# Patient Record
Sex: Female | Born: 1960 | Race: White | Hispanic: No | Marital: Married | Smoking: Never smoker
Health system: Southern US, Community
[De-identification: ages and names within clinical notes are randomized; demographics above are authoritative.]

## PROBLEM LIST (undated history)

## (undated) DIAGNOSIS — M8580 Other specified disorders of bone density and structure, unspecified site: Secondary | ICD-10-CM

## (undated) DIAGNOSIS — F329 Major depressive disorder, single episode, unspecified: Secondary | ICD-10-CM

## (undated) DIAGNOSIS — M5481 Occipital neuralgia: Secondary | ICD-10-CM

## (undated) DIAGNOSIS — S329XXA Fracture of unspecified parts of lumbosacral spine and pelvis, initial encounter for closed fracture: Secondary | ICD-10-CM

## (undated) DIAGNOSIS — Z8742 Personal history of other diseases of the female genital tract: Secondary | ICD-10-CM

## (undated) DIAGNOSIS — I10 Essential (primary) hypertension: Secondary | ICD-10-CM

## (undated) DIAGNOSIS — F32A Depression, unspecified: Secondary | ICD-10-CM

## (undated) DIAGNOSIS — H9319 Tinnitus, unspecified ear: Secondary | ICD-10-CM

## (undated) DIAGNOSIS — K219 Gastro-esophageal reflux disease without esophagitis: Secondary | ICD-10-CM

## (undated) DIAGNOSIS — M1990 Unspecified osteoarthritis, unspecified site: Secondary | ICD-10-CM

## (undated) DIAGNOSIS — E7800 Pure hypercholesterolemia, unspecified: Secondary | ICD-10-CM

## (undated) DIAGNOSIS — F419 Anxiety disorder, unspecified: Secondary | ICD-10-CM

## (undated) DIAGNOSIS — IMO0002 Reserved for concepts with insufficient information to code with codable children: Secondary | ICD-10-CM

## (undated) HISTORY — DX: Personal history of other diseases of the female genital tract: Z87.42

## (undated) HISTORY — DX: Gastro-esophageal reflux disease without esophagitis: K21.9

## (undated) HISTORY — DX: Occipital neuralgia: M54.81

## (undated) HISTORY — PX: SKIN CANCER EXCISION: SHX779

## (undated) HISTORY — DX: Depression, unspecified: F32.A

## (undated) HISTORY — DX: Other specified disorders of bone density and structure, unspecified site: M85.80

## (undated) HISTORY — DX: Fracture of unspecified parts of lumbosacral spine and pelvis, initial encounter for closed fracture: S32.9XXA

## (undated) HISTORY — DX: Reserved for concepts with insufficient information to code with codable children: IMO0002

## (undated) HISTORY — PX: OTHER SURGICAL HISTORY: SHX169

## (undated) HISTORY — DX: Essential (primary) hypertension: I10

## (undated) HISTORY — DX: Major depressive disorder, single episode, unspecified: F32.9

## (undated) HISTORY — PX: NOSE SURGERY: SHX723

---
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
## 1991-06-19 DIAGNOSIS — Z8742 Personal history of other diseases of the female genital tract: Secondary | ICD-10-CM

## 1991-06-19 HISTORY — DX: Personal history of other diseases of the female genital tract: Z87.42

## 1991-07-20 HISTORY — PX: WISDOM TOOTH EXTRACTION: SHX21

## 1991-07-20 HISTORY — PX: CERVICAL BIOPSY  W/ LOOP ELECTRODE EXCISION: SUR135

## 1999-08-10 ENCOUNTER — Other Ambulatory Visit: Admission: RE | Admit: 1999-08-10 | Discharge: 1999-08-10 | Payer: Self-pay | Admitting: *Deleted

## 1999-08-14 ENCOUNTER — Encounter: Payer: Self-pay | Admitting: Family Medicine

## 1999-08-14 ENCOUNTER — Encounter: Admission: RE | Admit: 1999-08-14 | Discharge: 1999-08-14 | Payer: Self-pay | Admitting: Family Medicine

## 2000-07-04 ENCOUNTER — Encounter: Admission: RE | Admit: 2000-07-04 | Discharge: 2000-07-04 | Payer: Self-pay | Admitting: Family Medicine

## 2000-07-04 ENCOUNTER — Encounter: Payer: Self-pay | Admitting: Family Medicine

## 2000-09-29 ENCOUNTER — Other Ambulatory Visit: Admission: RE | Admit: 2000-09-29 | Discharge: 2000-09-29 | Payer: Self-pay | Admitting: *Deleted

## 2001-08-08 ENCOUNTER — Encounter: Admission: RE | Admit: 2001-08-08 | Discharge: 2001-08-08 | Payer: Self-pay | Admitting: *Deleted

## 2001-08-08 ENCOUNTER — Encounter: Payer: Self-pay | Admitting: *Deleted

## 2001-11-10 ENCOUNTER — Other Ambulatory Visit: Admission: RE | Admit: 2001-11-10 | Discharge: 2001-11-10 | Payer: Self-pay | Admitting: *Deleted

## 2002-07-19 DIAGNOSIS — M5481 Occipital neuralgia: Secondary | ICD-10-CM

## 2002-07-19 HISTORY — DX: Occipital neuralgia: M54.81

## 2002-08-21 ENCOUNTER — Encounter: Admission: RE | Admit: 2002-08-21 | Discharge: 2002-08-21 | Payer: Self-pay | Admitting: *Deleted

## 2002-08-21 ENCOUNTER — Encounter: Payer: Self-pay | Admitting: *Deleted

## 2003-01-21 ENCOUNTER — Other Ambulatory Visit: Admission: RE | Admit: 2003-01-21 | Discharge: 2003-01-21 | Payer: Self-pay | Admitting: *Deleted

## 2003-09-04 ENCOUNTER — Ambulatory Visit (HOSPITAL_COMMUNITY): Admission: RE | Admit: 2003-09-04 | Discharge: 2003-09-04 | Payer: Self-pay | Admitting: Family Medicine

## 2004-05-12 ENCOUNTER — Other Ambulatory Visit: Admission: RE | Admit: 2004-05-12 | Discharge: 2004-05-12 | Payer: Self-pay | Admitting: *Deleted

## 2004-08-14 ENCOUNTER — Encounter: Admission: RE | Admit: 2004-08-14 | Discharge: 2004-08-14 | Payer: Self-pay | Admitting: Sports Medicine

## 2004-09-04 ENCOUNTER — Ambulatory Visit (HOSPITAL_COMMUNITY): Admission: RE | Admit: 2004-09-04 | Discharge: 2004-09-04 | Payer: Self-pay | Admitting: *Deleted

## 2004-12-08 ENCOUNTER — Encounter: Admission: RE | Admit: 2004-12-08 | Discharge: 2004-12-08 | Payer: Self-pay | Admitting: Sports Medicine

## 2005-04-16 ENCOUNTER — Other Ambulatory Visit: Admission: RE | Admit: 2005-04-16 | Discharge: 2005-04-16 | Payer: Self-pay | Admitting: *Deleted

## 2005-09-08 ENCOUNTER — Ambulatory Visit (HOSPITAL_COMMUNITY): Admission: RE | Admit: 2005-09-08 | Discharge: 2005-09-08 | Payer: Self-pay | Admitting: Obstetrics and Gynecology

## 2005-10-08 ENCOUNTER — Encounter: Admission: RE | Admit: 2005-10-08 | Discharge: 2005-10-08 | Payer: Self-pay | Admitting: Sports Medicine

## 2005-10-08 ENCOUNTER — Ambulatory Visit: Payer: Self-pay | Admitting: Sports Medicine

## 2005-11-02 ENCOUNTER — Ambulatory Visit: Payer: Self-pay | Admitting: Sports Medicine

## 2006-05-31 ENCOUNTER — Other Ambulatory Visit: Admission: RE | Admit: 2006-05-31 | Discharge: 2006-05-31 | Payer: Self-pay | Admitting: Obstetrics & Gynecology

## 2006-09-09 ENCOUNTER — Ambulatory Visit (HOSPITAL_COMMUNITY): Admission: RE | Admit: 2006-09-09 | Discharge: 2006-09-09 | Payer: Self-pay | Admitting: Obstetrics and Gynecology

## 2007-07-28 ENCOUNTER — Other Ambulatory Visit: Admission: RE | Admit: 2007-07-28 | Discharge: 2007-07-28 | Payer: Self-pay | Admitting: Obstetrics and Gynecology

## 2007-09-12 ENCOUNTER — Ambulatory Visit (HOSPITAL_COMMUNITY): Admission: RE | Admit: 2007-09-12 | Discharge: 2007-09-12 | Payer: Self-pay | Admitting: Obstetrics and Gynecology

## 2008-02-07 ENCOUNTER — Ambulatory Visit: Payer: Self-pay | Admitting: Sports Medicine

## 2008-02-07 DIAGNOSIS — M5431 Sciatica, right side: Secondary | ICD-10-CM | POA: Insufficient documentation

## 2008-02-07 DIAGNOSIS — M5430 Sciatica, unspecified side: Secondary | ICD-10-CM | POA: Insufficient documentation

## 2008-02-07 DIAGNOSIS — M76899 Other specified enthesopathies of unspecified lower limb, excluding foot: Secondary | ICD-10-CM | POA: Insufficient documentation

## 2008-03-26 ENCOUNTER — Ambulatory Visit: Payer: Self-pay | Admitting: Sports Medicine

## 2008-08-09 ENCOUNTER — Other Ambulatory Visit: Admission: RE | Admit: 2008-08-09 | Discharge: 2008-08-09 | Payer: Self-pay | Admitting: Obstetrics and Gynecology

## 2008-08-16 ENCOUNTER — Encounter: Admission: RE | Admit: 2008-08-16 | Discharge: 2008-08-16 | Payer: Self-pay | Admitting: Obstetrics & Gynecology

## 2008-09-09 ENCOUNTER — Ambulatory Visit (HOSPITAL_COMMUNITY): Admission: RE | Admit: 2008-09-09 | Discharge: 2008-09-09 | Payer: Self-pay | Admitting: Obstetrics and Gynecology

## 2009-09-15 ENCOUNTER — Ambulatory Visit (HOSPITAL_COMMUNITY): Admission: RE | Admit: 2009-09-15 | Discharge: 2009-09-15 | Payer: Self-pay | Admitting: Obstetrics and Gynecology

## 2010-02-09 ENCOUNTER — Ambulatory Visit: Payer: Self-pay | Admitting: Sports Medicine

## 2010-02-09 DIAGNOSIS — R42 Dizziness and giddiness: Secondary | ICD-10-CM | POA: Insufficient documentation

## 2010-08-17 ENCOUNTER — Other Ambulatory Visit (HOSPITAL_COMMUNITY): Payer: Self-pay | Admitting: Occupational Therapy

## 2010-08-17 DIAGNOSIS — Z1231 Encounter for screening mammogram for malignant neoplasm of breast: Secondary | ICD-10-CM

## 2010-08-17 DIAGNOSIS — Z139 Encounter for screening, unspecified: Secondary | ICD-10-CM

## 2010-08-20 NOTE — Assessment & Plan Note (Signed)
Summary: LOWER BACK PAIN,MC   Vital Signs:  Patient profile:   50 year old female Height:      66 inches BP sitting:   120 / 82  (left arm) Cuff size:   regular  Vitals Entered By: Tessie Fass CMA (February 09, 2010 9:57 AM) CC: low back pain   Primary Provider:  [facility]  CC:  low back pain.  History of Present Illness: Approx. 3 month history of lightheaded/dizzy spells on going from sitting to standing. These seem to occur periodically for a few days at a time, with a few incidents per day and then resolve for a week or more.  On one occassion she had severe vertigo type symptoms and had to lie down on getting out of her car after a long drive. There are no other neurological or cardiovascular symptoms associated. She has had no change in her menstrual cycle and does not have heavy periods.  She says she has been keeping well hydrated during the warm weather however does seem to lose a lot of salt during exercise.  She also has had low back/piriformis pain for a number of years. These symptoms have not changed much since her last visit 2 years ago, and she seems to be able to cope with the problem. The pain affects her right anterior hip to upper buttocks, and is exacerbated by cycling and prolonged sitting. She has no radicular symptoms.  Physical Exam  General:  pulse rate 60, BP 115/75 alert, well-developed, well-nourished, and well-hydrated.    no real orthostatic change Eyes:  No signs of anaemia Neck:  full ROM.   No vetebro-basilar symptoms producable Heart:  normal rate and regular rhythm.   Msk:  Extension of Lumbar spine is limited and reproduces low back symptoms. Neurologic:  Dix-Hallpike maneuvre negative Additional Exam:  Lying and standing BP- both 115/75 on 2 occasions   Impression & Recommendations:  Problem # 1:  DIZZINESS (ICD-780.4) I think this is likely worsened by salt loss and perhaps some relative dehydration not severe no cardiac  sxs  supplement salt hydrate well and follow  Problem # 2:  SCIATICA (ICD-724.3)  Her updated medication list for this problem includes:    Mobic 15 Mg Tabs (Meloxicam) .Marland Kitchen... 1 by mouth once daily  this is challenging as cycling is major sport trial of addding stretches and exercises to see if response  Complete Medication List: 1)  Bupropion Hcl 300 Mg Xr24h-tab (Bupropion hcl) .Marland Kitchen.. 1 by mouth qd 2)  Mobic 15 Mg Tabs (Meloxicam) .Marland Kitchen.. 1 by mouth qd 3)  Prilosec Otc 20 Mg Tbec (Omeprazole magnesium) .... One by mouth daily  Patient Instructions: 1)  add flexion stretches for back 2)  knee to chest 3)  knee to opposite shoulder 4)  both knees to chest and rock 5)  try thermacare wraps for continuous heat to low back 6)  keep up piriformis stretch 7)  go to [email or web address] and check on high sodium food list 8)  try high sodium for at least 1 month

## 2010-09-21 ENCOUNTER — Ambulatory Visit (HOSPITAL_COMMUNITY)
Admission: RE | Admit: 2010-09-21 | Discharge: 2010-09-21 | Disposition: A | Discharge status: Home / Self Care | Payer: 59 | Source: Ambulatory Visit | Attending: Obstetrics and Gynecology | Admitting: Obstetrics and Gynecology

## 2010-09-21 DIAGNOSIS — Z1231 Encounter for screening mammogram for malignant neoplasm of breast: Secondary | ICD-10-CM | POA: Insufficient documentation

## 2011-08-23 ENCOUNTER — Other Ambulatory Visit (HOSPITAL_COMMUNITY): Payer: Self-pay | Admitting: Internal Medicine

## 2011-08-23 DIAGNOSIS — Z1231 Encounter for screening mammogram for malignant neoplasm of breast: Secondary | ICD-10-CM

## 2011-08-23 LAB — HM PAP SMEAR: HM PAP: NEGATIVE

## 2011-09-28 ENCOUNTER — Ambulatory Visit (HOSPITAL_COMMUNITY)
Admission: RE | Admit: 2011-09-28 | Discharge: 2011-09-28 | Disposition: A | Discharge status: Home / Self Care | Payer: 59 | Source: Ambulatory Visit | Attending: Internal Medicine | Admitting: Internal Medicine

## 2011-09-28 DIAGNOSIS — Z1231 Encounter for screening mammogram for malignant neoplasm of breast: Secondary | ICD-10-CM | POA: Insufficient documentation

## 2011-09-30 ENCOUNTER — Other Ambulatory Visit: Payer: Self-pay | Admitting: Internal Medicine

## 2011-09-30 DIAGNOSIS — R928 Other abnormal and inconclusive findings on diagnostic imaging of breast: Secondary | ICD-10-CM

## 2011-10-07 ENCOUNTER — Ambulatory Visit
Admission: RE | Admit: 2011-10-07 | Discharge: 2011-10-07 | Disposition: A | Discharge status: Home / Self Care | Payer: 59 | Source: Ambulatory Visit | Attending: Internal Medicine | Admitting: Internal Medicine

## 2011-10-07 DIAGNOSIS — R928 Other abnormal and inconclusive findings on diagnostic imaging of breast: Secondary | ICD-10-CM

## 2012-11-20 ENCOUNTER — Encounter: Payer: Self-pay | Admitting: Sports Medicine

## 2012-11-20 ENCOUNTER — Ambulatory Visit (INDEPENDENT_AMBULATORY_CARE_PROVIDER_SITE_OTHER): Payer: 59 | Admitting: Sports Medicine

## 2012-11-20 VITALS — BP 134/90 | HR 61 | Ht 66.0 in | Wt 144.0 lb

## 2012-11-20 DIAGNOSIS — M25559 Pain in unspecified hip: Secondary | ICD-10-CM

## 2012-11-20 DIAGNOSIS — M25551 Pain in right hip: Secondary | ICD-10-CM

## 2012-11-20 NOTE — Progress Notes (Signed)
  Subjective:    Patient ID: Donna Ferrell, female    DOB: 01-Mar-1961, 52 y.o.   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 I initially saw her as a patient at [facility] back in 2006. At that time, an MRI of her right hip showed a stress fracture. She ultimately made a full recovery in her current pain is different in location and what she experienced at that time. She describes an aching discomfort that begins along the posterior lateral hip and at times were radiate down the lateral thigh to the lateral knee. She denies any groin pain. She was seen by Dr. Darrick Penna previously and diagnosed with a piriformis strain/tear and a cortisone injection was administered. The injection worked only for a few days and she developed a little bit of fatty atrophy from the injection which ultimately resolved. She's been working extensively with physical therapy, specifically with Ellamae Sia. He has made some good progress but she has plateaued. She is here today to discuss further treatment options and is asking specifically about prolotherapy. She denies any significant pain past the knee. She denies any significant numbness or tingling in the leg. She has been on Mobic daily for quite some time but is concerned because she is also on a PPI. She has questions about how she should take her Mobic.  Medications are reviewed She has no known drug allergies     Review of Systems     Objective:   Physical Exam Well-developed, well-nourished. No acute distress. Awake alert and oriented x3. Vital signs are reviewed  Right hip demonstrate smooth painless hip range of motion and a negative log roll in a sitting position. There is palpable tenderness along the insertion of both the piriformis ankle views medius tendons. She has fairly good hip abductor strength. Negative  straight leg raise. Strength is 5/5 both lower extremities. Reflexes are equal at the Achilles and patellar tendons. She is walking without significant limp.       Assessment & Plan:  1. Chronic right hip pain likely secondary to gluteus medius strain/tendinopathy  I've given the patient 2 additional exercises to do. They include hip abductor strengthening as well as hip external rotation exercises. We discussed the possibility of PRP with Lenora Boys in [facility] and we also discussed the possibility of prolotherapy. She has a friend who has had prolotherapy in [place]. She will let me know how she would like to proceed in regards to both of these. In the meantime, we will schedule a followup appointment for new orthotics. Dr. Darrick Penna made her some orthotics several years ago and they're quite worn and she would like a new pair.

## 2012-11-30 ENCOUNTER — Ambulatory Visit: Payer: 59 | Admitting: Sports Medicine

## 2012-12-04 ENCOUNTER — Ambulatory Visit (INDEPENDENT_AMBULATORY_CARE_PROVIDER_SITE_OTHER): Payer: 59 | Admitting: Sports Medicine

## 2012-12-04 ENCOUNTER — Encounter: Payer: Self-pay | Admitting: Sports Medicine

## 2012-12-04 VITALS — BP 130/86 | HR 56 | Ht 66.0 in | Wt 144.0 lb

## 2012-12-04 DIAGNOSIS — M25559 Pain in unspecified hip: Secondary | ICD-10-CM

## 2012-12-04 DIAGNOSIS — S66912A Strain of unspecified muscle, fascia and tendon at wrist and hand level, left hand, initial encounter: Secondary | ICD-10-CM

## 2012-12-04 DIAGNOSIS — M25551 Pain in right hip: Secondary | ICD-10-CM

## 2012-12-04 DIAGNOSIS — S63509A Unspecified sprain of unspecified wrist, initial encounter: Secondary | ICD-10-CM

## 2012-12-04 NOTE — Progress Notes (Signed)
  Subjective:    Patient ID: Donna Ferrell, female    DOB: [birth year], 52 y.o.   MRN: [id number]  HPI Patient comes in today for orthotics. She is also complaining of some left wrist pain. Pain began after she was doing some yard work a little over a week ago. She felt a pull along the ulnar aspect of her wrist. No swelling. No bruising. Pain is present only intermittently. No similar problems in the past.    Review of Systems     Objective:   Physical Exam Well-developed, no acute distress  Left wrist: Full range of motion. There is tenderness to palpation along the ECU tendon. No tenderness to palpation over the TFCC. Mild pain with ECU stretching. No soft tissue swelling. No other bony or soft tissue tenderness to direct palpation. Neurovascularly intact distally.       Assessment & Plan:  1. Left wrist pain secondary to mild ECU strain 2. History of gluteus medius strain 3. History of sciatica  Wrist loop to be worn with activity on the left wrist. She will at me know if symptoms persist or worsen. We will ahead and constructed a new pair of orthotics for her today. We did discuss the possibility of prolotherapy at her last office visit for her gluteus medius tendinopathy. She is asking about seeing Dr. Ardell Isaacs here in [place]. I have no objections. She will followup with me when necessary.  Patient was fitted for a : standard, cushioned, semi-rigid orthotic. The orthotic was heated and afterward the patient stood on the orthotic blank positioned on the orthotic stand. The patient was positioned in subtalar neutral position and 10 degrees of ankle dorsiflexion in a weight bearing stance. After completion of molding, a stable base was applied to the orthotic blank. The blank was ground to a stable position for weight bearing. Size:10 Base: EVA Posting: Additional orthotic padding: B/L metatarsal pads

## 2013-04-11 ENCOUNTER — Ambulatory Visit: Payer: 59 | Admitting: Sports Medicine

## 2013-04-23 ENCOUNTER — Encounter: Payer: Self-pay | Admitting: Sports Medicine

## 2013-04-23 ENCOUNTER — Ambulatory Visit (INDEPENDENT_AMBULATORY_CARE_PROVIDER_SITE_OTHER): Payer: 59 | Admitting: Sports Medicine

## 2013-04-23 VITALS — BP 130/91 | HR 66 | Ht 66.0 in | Wt 144.0 lb

## 2013-04-23 DIAGNOSIS — M25561 Pain in right knee: Secondary | ICD-10-CM

## 2013-04-23 DIAGNOSIS — M25569 Pain in unspecified knee: Secondary | ICD-10-CM

## 2013-04-23 MED ORDER — DICLOFENAC SODIUM 1 % TD GEL: 4.0000 g | Freq: Four times a day (QID) | TRANSDERMAL | Status: DC

## 2013-04-23 NOTE — Progress Notes (Signed)
  Subjective:    Patient ID: Donna Ferrell, female    DOB: October 26, 1960, 52 y.o.   MRN: [id number]  HPI chief complaint: Right knee pain   Patient comes in today complaining of several weeks of lateral right knee pain. She has been struggling with right hip pain for quite some time. In fact, she is set to try some dry needling with Amado Coe starting tomorrow. In addition to her hip pain she has begun to develop radiating pain down the lateral thigh to the knee. No associated numbness or tingling. She has not noticed any knee swelling. No giving way. No mechanical symptoms in the knee.    Review of Systems     Objective:   Physical Exam Right hip: Tenderness to palpation at the insertion of the gluteus medius tendon. Smooth painless hip range of motion with a negative logroll. Good hip strength.  Right knee: Full range of motion. No effusion. Mild tenderness to palpation along the distal IT band but no tenderness over the lateral joint line. No tenderness over the medial joint line to palpation. Negative McMurray's but a positive Thessaly's which reproduces pain up the lateral aspect of the right lower leg. Knee is grossly stable to ligamentous exam. Walking without significant limp.       Assessment & Plan:  Right knee pain likely secondary to distal IT band inflammation which is compensatory for chronic right gluteus medius tendinopathy  Patient will proceed with dry needling as scheduled for her right hip. She is well-versed in IT band stretching and has good hip abductor strength on today's exam. Voltaren gel to apply to both the hip and knee as needed. Followup with me in 4 weeks. If symptoms persist consider merits of a diagnostic/therapeutic right knee intra-articular cortisone injection. Call with questions or concerns in the interim.

## 2013-05-21 ENCOUNTER — Encounter: Payer: Self-pay | Admitting: Sports Medicine

## 2013-05-21 ENCOUNTER — Ambulatory Visit (INDEPENDENT_AMBULATORY_CARE_PROVIDER_SITE_OTHER): Payer: 59 | Admitting: Sports Medicine

## 2013-05-21 VITALS — BP 127/80 | HR 70 | Ht 66.0 in | Wt 144.0 lb

## 2013-05-21 DIAGNOSIS — M25559 Pain in unspecified hip: Secondary | ICD-10-CM

## 2013-05-21 DIAGNOSIS — M25551 Pain in right hip: Secondary | ICD-10-CM

## 2013-05-21 DIAGNOSIS — M25569 Pain in unspecified knee: Secondary | ICD-10-CM

## 2013-05-21 DIAGNOSIS — M25561 Pain in right knee: Secondary | ICD-10-CM

## 2013-05-21 NOTE — Progress Notes (Signed)
  Subjective:    Patient ID: Donna Ferrell, female    DOB: [birth year], 52 y.o.   MRN: [id number]  HPI Patient comes in today for followup on right hip and right knee pain. Overall, she is about 50% improved. She has been experimenting with some dry needling which has been done by Amado Coe. She has had good results with this although she is not completely pain-free. Her lateral knee pain is now more in the lateral aspect of her calf. No numbness or tingling. She admits that she has not been doing her hip strengthening exercises. Voltaren gel has been helpful.  Her goal is to return to some running or cycling but she feels like her symptoms are still preventing her from being able to do this.    Review of Systems     Objective:   Physical Exam Well-developed, well-nourished. No acute distress  Right hip: Still some tenderness to palpation at the insertion of the gluteus medius tendon. Still some demonstrable hip abductor weakness. Right knee: Minimal tenderness along the distal IT band. Mild tenderness to palpation along the lateral calf. No atrophy. Neurological exam: Reflexes are trace but equal at the Achilles and patellar tendons bilaterally. She does have 4/5 strength with resisted great toe extension on the right in comparison to the left. Otherwise, strength is 5/5. Patient walks without a limp.      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 She will become more compliant with her hip strengthening and will followup in 4 weeks. Continue with Voltaren gel. Given the location of the lateral calf pain and weakness with great toe extension, there is the possibility that some of her symptoms may be related to L5 nerve root irritation. We discussed the possibility of an EMG/nerve conduction study if symptoms warrant  down the road but given her overall improvement I am going to hold on that for now. Patient will call me with questions or concerns prior to her followup visit.

## 2013-06-18 ENCOUNTER — Ambulatory Visit (INDEPENDENT_AMBULATORY_CARE_PROVIDER_SITE_OTHER): Payer: 59 | Admitting: Sports Medicine

## 2013-06-18 ENCOUNTER — Encounter: Payer: Self-pay | Admitting: Sports Medicine

## 2013-06-18 VITALS — BP 141/82 | HR 61 | Ht 66.0 in | Wt 144.0 lb

## 2013-06-18 DIAGNOSIS — M25561 Pain in right knee: Secondary | ICD-10-CM

## 2013-06-18 DIAGNOSIS — M25569 Pain in unspecified knee: Secondary | ICD-10-CM

## 2013-06-18 NOTE — Progress Notes (Signed)
   Subjective:    Patient ID: Donna Ferrell, female    DOB: [birth year], 52 y.o.   MRN: [id number]  HPI Patient comes in today for followup on lateral right leg and knee pain. She has completed her dry needling sessions and feels like they were helpful. However, she feels like her symptoms haven't really improved since her last office visit. She describes an aching discomfort that begins in the lateral right upper leg and radiates down into the right lateral knee. The lateral calf pain and she was experiencing previously has improved. No weakness. She is able to exercise, specifically bike, without too much discomfort. It is only after exercise that she feels her pain. It will tend to last the rest of the day or until she applies heat. Some numbness and tingling. She admits that she has not been very diligent about doing her hip abductor strengthening but she has been doing quite a bit of stretching.   Review of Systems     Objective:   Physical Exam Sitting comfortably in the exam room. No acute distress.  Smooth painless hip range of motion of the right hip. There is no palpable tenderness along the lateral thigh or distal IT band. No joint effusion. Her last exam showed 4/5 strength with resisted great toe extension on the right when compared to the left but today's exam shows 5/5 strength bilaterally. Remainder of her strength is 5/5 as well. Walking without a limp.      Assessment & Plan:  Persistent right lateral leg and knee pain, likely due to IT band syndrome  Patient will be more diligent about doing her hip abductor strengthening exercises. I've also added an IT band stretch. I've also recommended that she get a bike fit. Followup again with me in 4-5 weeks. Continue with heat as needed. Call with questions or concerns prior to her followup visit.

## 2013-07-23 ENCOUNTER — Ambulatory Visit (INDEPENDENT_AMBULATORY_CARE_PROVIDER_SITE_OTHER): Payer: 59 | Admitting: Sports Medicine

## 2013-07-23 ENCOUNTER — Encounter: Payer: Self-pay | Admitting: Sports Medicine

## 2013-07-23 VITALS — BP 123/85 | HR 69 | Ht 66.0 in | Wt 144.0 lb

## 2013-07-23 DIAGNOSIS — M545 Low back pain, unspecified: Secondary | ICD-10-CM

## 2013-07-23 NOTE — Progress Notes (Signed)
   Subjective:    Patient ID: Donna Ferrell, female    DOB: [birth year], 53 y.o.   MRN: [id number]  HPI Patient comes in today for followup on lateral right hip and leg pain. Lateral hip pain has improved with dry needling and physical therapy. She is now able to sleep on her right side at night more comfortably. However, the aching discomfort along the lateral knee and lateral calf persists. She's also getting some pain in the posterior right hip. No associated numbness or tingling but she does describe a burning and cramping discomfort along the lateral right lower leg which can occur with simply sitting. No groin pain. The leg does want to give way from time to time.    Review of Systems     Objective:   Physical Exam Well-developed, well-nourished. No acute distress  There is minimal tenderness to palpation over the right greater trochanter. Negative log roll. Equivocal straight leg raise. 4+/5 strength with resisted great toe extension on the right compared to the left. Full dorsiflexion and plantar flexion against resistance. There is some tenderness to palpation along the lateral lower leg. Reflexes are trace but equal at the Achilles and patellar tendons. Sensation is intact to light touch grossly. Walking without a limp.       Assessment & Plan:  Persistent lateral knee and lower leg pain-rule out lumbar radiculopathy  AP and lateral x-rays of the lumbar spine. Given her failure to improve with physical therapy and her weakness with great toe extension on the right, I may need to ultimately get an MRI scan of her lumbar spine. I will call her after I reviewed the x-rays of her lumbar spine. Okay to continue with activity as tolerated.

## 2013-07-27 ENCOUNTER — Ambulatory Visit
Admission: RE | Admit: 2013-07-27 | Discharge: 2013-07-27 | Disposition: A | Discharge status: Home / Self Care | Payer: 59 | Source: Ambulatory Visit | Attending: Sports Medicine | Admitting: Sports Medicine

## 2013-07-27 DIAGNOSIS — M545 Low back pain, unspecified: Secondary | ICD-10-CM

## 2013-08-07 ENCOUNTER — Other Ambulatory Visit: Payer: Self-pay | Admitting: Nurse Practitioner

## 2013-08-07 ENCOUNTER — Telehealth: Payer: Self-pay | Admitting: Sports Medicine

## 2013-08-07 DIAGNOSIS — Z1231 Encounter for screening mammogram for malignant neoplasm of breast: Secondary | ICD-10-CM

## 2013-08-07 NOTE — Telephone Encounter (Signed)
Message copied by Ralene CorkRAPER, TIMOTHY R on Tue Aug 07, 2013  3:40 PM ------      Message from: Annita BrodMOORE, NEETON C      Created: Wed Aug 01, 2013 11:31 AM      Regarding: FW: lower back xray      Contact: [phone]                   ----- Message -----         From: Lizbeth BarkMelanie L Ceresi         Sent: 08/01/2013  10:03 AM           To: Lillia PaulsNeeton Moore, CMA      Subject: lower back xray                                          Pt called to get her xray results, she said you can leave a detailed message on her voicemail if she doesn't pick up. ------

## 2013-08-07 NOTE — Telephone Encounter (Signed)
I spoke with the patient on the phone today after reviewing the x-rays of her lumbar spine. She has some degenerative disc disease at L2-L3 as well as L3-L4. She continues to have pain along the lateral aspect of her calf, knee, and thigh. Given her failure with conservative treatment to date I think we should get an MRI scan of her lumbar spine to see if she has any foraminal stenosis that correlates with her symptoms. If so, she would be a candidate for a diagnostic/therapeutic epidural steroid injection. I will call her after reviewing her MRI scan at which point we will delineate further treatment.

## 2013-08-08 ENCOUNTER — Ambulatory Visit (HOSPITAL_COMMUNITY): Payer: 59

## 2013-08-09 ENCOUNTER — Telehealth: Payer: Self-pay | Admitting: *Deleted

## 2013-08-09 DIAGNOSIS — IMO0002 Reserved for concepts with insufficient information to code with codable children: Secondary | ICD-10-CM

## 2013-08-09 NOTE — Telephone Encounter (Signed)
Message copied by Mora BellmanMARTIN, Aadhav Uhlig C on Thu Aug 09, 2013  2:11 PM ------      Message from: Ralene CorkRAPER, TIMOTHY R      Created: Tue Aug 07, 2013  3:42 PM      Regarding: FW: lower back xray      Contact: [phone]       Please schedule an MRI scan of her lumbar spine.      Degenerative disc disease... rule out HNP.            ----- Message -----         From: Lillia PaulsNeeton Moore, CMA         Sent: 08/01/2013  11:31 AM           To: Ralene Corkimothy R Draper, DO      Subject: FW: lower back xray                                                  ----- Message -----         From: Lizbeth BarkMelanie L Ceresi         Sent: 08/01/2013  10:03 AM           To: Lillia PaulsNeeton Moore, CMA      Subject: lower back xray                                          Pt called to get her xray results, she said you can leave a detailed message on her voicemail if she doesn't pick up.       ------

## 2013-08-09 NOTE — Telephone Encounter (Signed)
[facility] [address]

## 2013-08-10 ENCOUNTER — Ambulatory Visit (HOSPITAL_COMMUNITY)
Admission: RE | Admit: 2013-08-10 | Discharge: 2013-08-10 | Disposition: A | Discharge status: Home / Self Care | Payer: 59 | Source: Ambulatory Visit | Attending: Nurse Practitioner | Admitting: Nurse Practitioner

## 2013-08-10 DIAGNOSIS — Z1231 Encounter for screening mammogram for malignant neoplasm of breast: Secondary | ICD-10-CM

## 2013-08-14 ENCOUNTER — Other Ambulatory Visit: Payer: 59

## 2013-08-18 ENCOUNTER — Ambulatory Visit
Admission: RE | Admit: 2013-08-18 | Discharge: 2013-08-18 | Disposition: A | Discharge status: Home / Self Care | Payer: 59 | Source: Ambulatory Visit | Attending: Sports Medicine | Admitting: Sports Medicine

## 2013-08-18 DIAGNOSIS — IMO0002 Reserved for concepts with insufficient information to code with codable children: Secondary | ICD-10-CM

## 2013-08-21 DIAGNOSIS — IMO0002 Reserved for concepts with insufficient information to code with codable children: Secondary | ICD-10-CM

## 2013-08-21 HISTORY — DX: Reserved for concepts with insufficient information to code with codable children: IMO0002

## 2013-08-22 ENCOUNTER — Telehealth: Payer: Self-pay | Admitting: Sports Medicine

## 2013-08-22 ENCOUNTER — Telehealth: Payer: Self-pay | Admitting: *Deleted

## 2013-08-22 ENCOUNTER — Other Ambulatory Visit: Payer: Self-pay | Admitting: Sports Medicine

## 2013-08-22 DIAGNOSIS — M545 Low back pain, unspecified: Secondary | ICD-10-CM

## 2013-08-22 DIAGNOSIS — M549 Dorsalgia, unspecified: Secondary | ICD-10-CM

## 2013-08-22 NOTE — Telephone Encounter (Signed)
[facility]

## 2013-08-22 NOTE — Telephone Encounter (Signed)
Message copied by Mora BellmanMARTIN, Amaziah Raisanen C on Wed Aug 22, 2013  9:57 AM ------      Message from: Reino BellisRAPER, TIMOTHY R      Created: Wed Aug 22, 2013  8:36 AM      Regarding: lumbar ESI       Please schedule a diagnostic/therapeutic lumbar ESI at the L5-S1 level with [facility]. Then schedule a followup visit with me for 2 weeks after the injection.      Thanks!            ----- Message -----         From: Rad Results In Interface         Sent: 08/18/2013  11:28 AM           To: Ralene Corkimothy R Draper, DO                   ------

## 2013-08-22 NOTE — Telephone Encounter (Signed)
I spoke with the patient on the phone today after reviewing the MRI scan of her lumbar spine. In regards to her right lower leg pain and weakness, there are findings at L5-S1 consistent with right foraminal encroachment and possible impingement of the right L5 nerve root. She has degenerative changes at other levels but nothing that would correspond with her right leg symptoms. At this point we will pursue a diagnostic/therapeutic lumbar epidural steroid injection. Patient will followup with me approximately 2 weeks after that procedure for a check on her progress. Call with questions or concerns in the interim.

## 2013-08-22 NOTE — Telephone Encounter (Signed)
Message copied by Ralene CorkRAPER, TIMOTHY R on Wed Aug 22, 2013  8:33 AM ------      Message from: Lizbeth BarkERESI, MELANIE L      Created: Tue Aug 21, 2013 11:02 AM      Regarding: mri results      Contact: [phone]       Pt had MRI on Saturday, she wants to know if you can call her with the results. Thanks! ------

## 2013-08-23 ENCOUNTER — Ambulatory Visit
Admission: RE | Admit: 2013-08-23 | Discharge: 2013-08-23 | Disposition: A | Discharge status: Home / Self Care | Payer: 59 | Source: Ambulatory Visit | Attending: Sports Medicine | Admitting: Sports Medicine

## 2013-08-23 ENCOUNTER — Other Ambulatory Visit: Payer: 59

## 2013-08-23 VITALS — BP 150/94 | HR 55

## 2013-08-23 DIAGNOSIS — M549 Dorsalgia, unspecified: Secondary | ICD-10-CM

## 2013-08-23 DIAGNOSIS — M25561 Pain in right knee: Secondary | ICD-10-CM

## 2013-08-23 MED ORDER — IOHEXOL 180 MG/ML  SOLN
1.0000 mL | Freq: Once | INTRAMUSCULAR | Status: AC | PRN
  Administered 2013-08-23: 1 mL via EPIDURAL

## 2013-08-23 MED ORDER — METHYLPREDNISOLONE ACETATE 40 MG/ML INJ SUSP (RADIOLOG
120.0000 mg | Freq: Once | INTRAMUSCULAR | Status: AC
  Administered 2013-08-23: 120 mg via EPIDURAL

## 2013-08-23 NOTE — Discharge Instructions (Signed)

## 2013-08-24 ENCOUNTER — Encounter: Payer: Self-pay | Admitting: Nurse Practitioner

## 2013-08-27 ENCOUNTER — Encounter: Payer: Self-pay | Admitting: Nurse Practitioner

## 2013-08-27 ENCOUNTER — Ambulatory Visit (INDEPENDENT_AMBULATORY_CARE_PROVIDER_SITE_OTHER): Payer: 59 | Admitting: Nurse Practitioner

## 2013-08-27 VITALS — BP 142/84 | HR 52 | Ht 66.0 in | Wt 141.0 lb

## 2013-08-27 DIAGNOSIS — Z01419 Encounter for gynecological examination (general) (routine) without abnormal findings: Secondary | ICD-10-CM

## 2013-08-27 DIAGNOSIS — M5136 Other intervertebral disc degeneration, lumbar region: Secondary | ICD-10-CM

## 2013-08-27 DIAGNOSIS — F3289 Other specified depressive episodes: Secondary | ICD-10-CM

## 2013-08-27 DIAGNOSIS — F329 Major depressive disorder, single episode, unspecified: Secondary | ICD-10-CM

## 2013-08-27 DIAGNOSIS — N951 Menopausal and female climacteric states: Secondary | ICD-10-CM

## 2013-08-27 DIAGNOSIS — F32A Depression, unspecified: Secondary | ICD-10-CM

## 2013-08-27 DIAGNOSIS — Z8742 Personal history of other diseases of the female genital tract: Secondary | ICD-10-CM

## 2013-08-27 DIAGNOSIS — M5126 Other intervertebral disc displacement, lumbar region: Secondary | ICD-10-CM

## 2013-08-27 NOTE — Patient Instructions (Signed)

## 2013-08-27 NOTE — Progress Notes (Signed)
Patient ID: Donna Ferrell, female   DOB: [birth year], 53 y.o.   MRN: [id number] 53 y.o. G0P0 Significant Other Caucasian Fe here for annual exam.  Menses for this past year have been mostly regular.  Skipped May 2014, and in August only 1 day of cycle. Now LMP was 07/30/13. She normally has a flow X 1 day and spotting X 1 day. Some increase in night sweats.  She recently had epidural steroid injection done for disc bulging at L5 - S1.  Patient's last menstrual period was 07/30/2013.          Sexually active: yes  The current method of family planning is none.   Same sex partner. Exercising: yes  Cycling and walking, also lifts weights Smoker:  no  Health Maintenance: Pap:  08/23/11, WNL, neg HR HPV MMG:  08/10/13, Bi-Rads 1:  negative Colonoscopy:  3/12//2013, repeat in 10 years BMD:  2010 normal TDaP:  08/02/11 Labs:  PCP   reports that she has never smoked. She has never used smokeless tobacco. She reports that she drinks about 1.0 ounces of alcohol per week. She reports that she does not use illicit drugs.  Past Medical History  Diagnosis Date  . History of abnormal cervical Pap smear 12/92    CIN I, LEEP  . Occipital neuralgia 2004  . Osteopenia     had hip jury 2005 with initial BMD at osteopenia then follow up was normal  . GERD (gastroesophageal reflux disease)   . Depression   . Bulging disc 08/21/13    L 5 - S 1 had epidural cotisone 08/23/13    Past Surgical History  Procedure Laterality Date  . Wisdom tooth extraction  1993    Current Outpatient Prescriptions  Medication Sig Dispense Refill  . buPROPion (WELLBUTRIN XL) 300 MG 24 hr tablet Take 300 mg by mouth daily.      . cholecalciferol (VITAMIN D) 1000 UNITS tablet Take 2,000 Units by mouth daily.      . diclofenac sodium (VOLTAREN) 1 % GEL Apply 4 g topically 4 (four) times daily.  100 g  1  . meloxicam (MOBIC) 15 MG tablet Take 15 mg by mouth daily as needed.      . Multiple Vitamin (MULTIVITAMIN) tablet Take 1  tablet by mouth daily.      . pantoprazole (PROTONIX) 40 MG tablet Take 40 mg by mouth daily.       No current facility-administered medications for this visit.    Family History  Problem Relation Age of Onset  . Heart disease Mother   . Breast cancer Mother 63  . Diabetes Father   . Heart disease Father   . Hyperlipidemia Sister   . Multiple sclerosis Sister   . Hypertension Sister   . Thyroid disease Sister   . Hyperlipidemia Brother     ROS:  Pertinent items are noted in HPI.  Otherwise, a comprehensive ROS was negative.  Exam:   BP 142/84  Pulse 52  Ht 5\' 6"  (1.676 m)  Wt 141 lb (63.957 kg)  BMI 22.77 kg/m2  LMP 07/30/2013 Height: 5\' 6"  (167.6 cm)  Ht Readings from Last 3 Encounters:  08/27/13 5\' 6"  (1.676 m)  07/23/13 5\' 6"  (1.676 m)  06/18/13 5\' 6"  (1.676 m)    General appearance: alert, cooperative and appears stated age Head: Normocephalic, without obvious abnormality, atraumatic Neck: no adenopathy, supple, symmetrical, trachea midline and thyroid normal to inspection and palpation Lungs: clear to auscultation bilaterally Breasts: normal  appearance, no masses or tenderness Heart: regular rate and rhythm Abdomen: soft, non-tender; no masses,  no organomegaly Extremities: extremities normal, atraumatic, no cyanosis or edema Skin: Skin color, texture, turgor normal. No rashes or lesions Lymph nodes: Cervical, supraclavicular, and axillary nodes normal. No abnormal inguinal nodes palpated Neurologic: Grossly normal   Pelvic: External genitalia:  no lesions              Urethra:  normal appearing urethra with no masses, tenderness or lesions              Bartholin's and Skene's: normal                 Vagina: normal appearing vagina with normal color and discharge, no lesions              Cervix: anteverted              Pap taken: no Bimanual Exam:  Uterus:  normal size, contour, position, consistency, mobility, non-tender              Adnexa: no mass,  fullness, tenderness               Rectovaginal: Confirms               Anus:  normal sphincter tone, no lesions  A:  Well Woman with normal exam  Perimenopausal with slight irregular cycles  Lumbar disc bulging with epidural steroids  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  Counseled on breast self exam, mammography screening, adequate intake of calcium and vitamin D, diet and exercise return annually or prn  An After Visit Summary was printed and given to the patient.

## 2013-08-30 NOTE — Progress Notes (Signed)
Encounter reviewed by Dr. Alexey Rhoads Silva.  

## 2013-09-03 ENCOUNTER — Ambulatory Visit (INDEPENDENT_AMBULATORY_CARE_PROVIDER_SITE_OTHER): Payer: 59 | Admitting: Sports Medicine

## 2013-09-03 ENCOUNTER — Other Ambulatory Visit: Payer: Self-pay | Admitting: Sports Medicine

## 2013-09-03 ENCOUNTER — Encounter: Payer: Self-pay | Admitting: Sports Medicine

## 2013-09-03 VITALS — BP 133/88 | Ht 66.0 in | Wt 141.0 lb

## 2013-09-03 DIAGNOSIS — M545 Low back pain, unspecified: Secondary | ICD-10-CM

## 2013-09-03 DIAGNOSIS — M549 Dorsalgia, unspecified: Secondary | ICD-10-CM

## 2013-09-03 NOTE — Progress Notes (Signed)
   Subjective:    Patient ID: Donna Ferrell, female    DOB: [birth year], 53 y.o.   MRN: [id number]  HPI Patient comes in today for followup. She recently underwent a diagnostic lumbar ESI. Injection was directed at the L5-S1 level. Patient states that immediately after the injection she had complete symptom relief. Unfortunately, her symptoms returned a couple of days later. Now experiencing some tingling and numbness along the plantar aspect of her foot. Overall, symptoms are stable however.    Review of Systems     Objective:   Physical Exam Unchanged from previous exam       Assessment & Plan:  Right lower leg pain with MRI evidence of degenerative disc disease  Since the patient had a positive albeit short lived response to the initial injection, I recommended a [facility] second injection. She will also resume physical therapy. Of note, she is also getting some neck pain so I'll have the therapist work on her low back and her neck. I've asked the patient to followup with me again in one week after her second lumbar ESI.

## 2013-09-18 ENCOUNTER — Ambulatory Visit
Admission: RE | Admit: 2013-09-18 | Discharge: 2013-09-18 | Disposition: A | Discharge status: Home / Self Care | Payer: 59 | Source: Ambulatory Visit | Attending: Sports Medicine | Admitting: Sports Medicine

## 2013-09-18 ENCOUNTER — Other Ambulatory Visit: Payer: Self-pay | Admitting: Sports Medicine

## 2013-09-18 VITALS — BP 154/86 | HR 64

## 2013-09-18 DIAGNOSIS — M549 Dorsalgia, unspecified: Secondary | ICD-10-CM

## 2013-09-18 DIAGNOSIS — M25561 Pain in right knee: Secondary | ICD-10-CM

## 2013-09-18 MED ORDER — METHYLPREDNISOLONE ACETATE 40 MG/ML INJ SUSP (RADIOLOG
120.0000 mg | Freq: Once | INTRAMUSCULAR | Status: AC
  Administered 2013-09-18: 120 mg via EPIDURAL

## 2013-09-18 MED ORDER — IOHEXOL 180 MG/ML  SOLN
1.0000 mL | Freq: Once | INTRAMUSCULAR | Status: AC | PRN
  Administered 2013-09-18: 1 mL via EPIDURAL

## 2013-09-24 ENCOUNTER — Ambulatory Visit (INDEPENDENT_AMBULATORY_CARE_PROVIDER_SITE_OTHER): Payer: 59 | Admitting: Sports Medicine

## 2013-09-24 ENCOUNTER — Encounter: Payer: Self-pay | Admitting: Sports Medicine

## 2013-09-24 VITALS — BP 130/92 | HR 65 | Ht 66.0 in | Wt 141.0 lb

## 2013-09-24 DIAGNOSIS — IMO0002 Reserved for concepts with insufficient information to code with codable children: Secondary | ICD-10-CM

## 2013-09-24 DIAGNOSIS — M5416 Radiculopathy, lumbar region: Secondary | ICD-10-CM

## 2013-09-24 NOTE — Progress Notes (Signed)
   Subjective:    Patient ID: Donna Ferrell, female    DOB: 1960/10/22, 53 y.o.   MRN: [id number]  HPI Patient comes in today for followup on right leg radiculopathy. Overall, she is doing better. She received a second lumbar ESI on March 3. This was directed at the L5 nerve root. The injection did result in concordant pain with needle placement and injection of contrast and anesthetic. She has also seen physical therapy and was given a new set of exercises. She is asking about when she can start doing some strengthening exercises.    Review of Systems     Objective:   Physical Exam Well-developed, well-nourished. No acute distress. Awake alert and oriented x3. Vital signs are reviewed.  Negative straight leg raise bilaterally. There is still a slight amount of weakness with resisted great toe extension on the right but not marked. Remainder of her strength is 5/5. Neurovascular intact distally.       Assessment & Plan:  Improved L5 right leg radiculopathy  Given her improvement in symptoms we will hold on the third lumbar ESI for the time being. She will continue with her home exercises and she has another followup appointment with physical therapy on March 23. I've asked her to wait until April before introducing any lower extremity strengthening exercises and then to be cautious and add them in one at a time. Otherwise, I think she can continue to increase activity as tolerated and will followup with me if symptoms recur or worsen.

## 2013-11-08 ENCOUNTER — Other Ambulatory Visit: Payer: Self-pay | Admitting: *Deleted

## 2013-11-08 DIAGNOSIS — M545 Low back pain, unspecified: Secondary | ICD-10-CM

## 2013-11-08 DIAGNOSIS — M5430 Sciatica, unspecified side: Secondary | ICD-10-CM

## 2013-11-09 ENCOUNTER — Other Ambulatory Visit: Payer: Self-pay | Admitting: Sports Medicine

## 2013-11-09 DIAGNOSIS — M549 Dorsalgia, unspecified: Secondary | ICD-10-CM

## 2013-11-20 ENCOUNTER — Ambulatory Visit
Admission: RE | Admit: 2013-11-20 | Discharge: 2013-11-20 | Disposition: A | Discharge status: Home / Self Care | Payer: 59 | Source: Ambulatory Visit | Attending: Sports Medicine | Admitting: Sports Medicine

## 2013-11-20 VITALS — BP 146/99 | HR 56

## 2013-11-20 DIAGNOSIS — M549 Dorsalgia, unspecified: Secondary | ICD-10-CM

## 2013-11-20 DIAGNOSIS — M5430 Sciatica, unspecified side: Secondary | ICD-10-CM

## 2013-11-20 MED ORDER — IOHEXOL 180 MG/ML  SOLN
1.0000 mL | Freq: Once | INTRAMUSCULAR | Status: AC | PRN
  Administered 2013-11-20: 1 mL via EPIDURAL

## 2013-11-20 MED ORDER — METHYLPREDNISOLONE ACETATE 40 MG/ML INJ SUSP (RADIOLOG
120.0000 mg | Freq: Once | INTRAMUSCULAR | Status: AC
  Administered 2013-11-20: 120 mg via EPIDURAL

## 2013-12-17 ENCOUNTER — Encounter: Payer: Self-pay | Admitting: Sports Medicine

## 2013-12-17 ENCOUNTER — Ambulatory Visit (INDEPENDENT_AMBULATORY_CARE_PROVIDER_SITE_OTHER): Payer: 59 | Admitting: Sports Medicine

## 2013-12-17 VITALS — BP 128/87 | Ht 66.0 in | Wt 133.0 lb

## 2013-12-17 DIAGNOSIS — M25551 Pain in right hip: Secondary | ICD-10-CM

## 2013-12-17 DIAGNOSIS — M25559 Pain in unspecified hip: Secondary | ICD-10-CM

## 2013-12-17 NOTE — Progress Notes (Signed)
   Subjective:    Patient ID: Donna Ferrell, female    DOB: September 05, 1960, 53 y.o.   MRN: [id number]  HPI Patient comes in today for returning right leg radiculopathy. At the time of her last visit she had undergone a second lumbar ESI which have provided her with excellent symptom relief. However, when her pain returned, she went for a third epidural steroid injection in that no symptom relief. She has been working with Ellamae Sia who feels like she may also have a gluteus medius issue. Patient tells me that she has pain just posterior to the right greater trochanter. Radiating pain down the lateral aspect of the leg to the knee but nothing into the calf and the foot. No weakness. She has tried dry needling in the past without any good results. She is wondering about trying a cortisone injection in her area of pain. She takes daily meloxicam for her neck. She's tried Voltaren gel on her knee but it is been ineffective.    Review of Systems     Objective:   Physical Exam Well-developed, well-nourished. No acute distress. Awake alert and oriented x3. Vital signs reviewed.  Right hip: There is tenderness to palpation at the insertion of the gluteus medius tendon just posterior to the greater trochanter. She has moderate hip abductor weakness. Also tenderness to palpation along the distal IT band. No gross neurological deficit. Walking without a limp.       Assessment & Plan:  Returning right leg radiculopathy  Since the patient did not get any symptom relief with her third lumbar ESI, I think we will focus on hip strengthening: Specifically hip abductor and hip external rotator strengthening. I also want the patient to resume IT band stretching and followup with me in 4 weeks. She'll continue to work with Ellamae Sia in the meantime. I don't think that a cortisone injection will be of much benefit and she has not received much benefit with dry needling in the past.

## 2014-01-22 ENCOUNTER — Encounter: Payer: Self-pay | Admitting: Sports Medicine

## 2014-01-22 ENCOUNTER — Ambulatory Visit (INDEPENDENT_AMBULATORY_CARE_PROVIDER_SITE_OTHER): Payer: 59 | Admitting: Sports Medicine

## 2014-01-22 VITALS — BP 122/85 | Ht 66.0 in | Wt 140.0 lb

## 2014-01-22 DIAGNOSIS — M5416 Radiculopathy, lumbar region: Secondary | ICD-10-CM

## 2014-01-22 DIAGNOSIS — IMO0002 Reserved for concepts with insufficient information to code with codable children: Secondary | ICD-10-CM

## 2014-01-22 MED ORDER — GABAPENTIN 300 MG PO CAPS: ORAL_CAPSULE | ORAL | Status: DC

## 2014-01-22 NOTE — Progress Notes (Signed)
   Subjective:    Patient ID: Donna Ferrell, female    DOB: 1961/03/07, 53 y.o.   MRN: [id number]  HPI Patient comes in today for followup. She states that she feels "about the same". Still getting discomfort along the lateral aspect of her right thigh with occasional radiating discomfort into the lateral lower leg. She's also getting intermittent numbness and tingling in her foot as well some occasional weakness with prolonged cycling. Majority of her discomfort is present with prolonged walking. She has been working on her hip strengthening exercises. She is also found the IT band stretches to be helpful.    Review of Systems     Objective:   Physical Exam Well-developed, well-nourished. No acute distress. Awake alert and oriented x3. Vital signs are reviewed.  Right hip: Just a mild amount of weakness today with resisted hip abduction. No tenderness to palpation of the greater trochanteric bursa. Negative log roll.  Neurological exam: Negative straight leg raise. Slight amount of weakness with resisted great toe extension on the right compared to the left but otherwise strength is 5/5 bilaterally. Reflexes are trace but equal at the Achilles and patellar tendons. No atrophy. Sensation is intact to light touch grossly.         Assessment & Plan:  Persistent right leg radiculopathy  Previous MRI showed moderate foraminal stenosis bilaterally at L5-S1. I still think the majority of her symptoms are arising from her lumbar spine. She has been doing some hip strengthening exercises as well as core strengthening but the core strengthening is causing her some discomfort. I would like to simplify the approach to this patient by simply giving her some McKenzie-type extension exercises to add to her hip exercises. She will avoid core strengthening for now. I would also like to try her on Neurontin 300 mg each bedtime for 7 nights and then increase the dose to twice a day thereafter. I will  touch base with her via telephone in a couple of weeks to see how she is doing. I think she can continue with activity as tolerated. She previously underwent successful lumbar epidural steroid injections and if her symptoms weren't we could consider repeating those as early as August.

## 2014-02-13 ENCOUNTER — Telehealth: Payer: Self-pay | Admitting: Sports Medicine

## 2014-02-13 NOTE — Telephone Encounter (Signed)
I spoke with the patient on the phone last week and requested that she come in to the office briefly today for me to reevaluate her. She is still complaining of persistent pain along the lateral right knee with associated numbness tingling and weakness into her lateral right lower leg and foot and ankle. Symptoms are worse with walking. She does not feel like this is a compartment syndrome (she denies any real tightness in her lower leg). Just some cramping, numbness, tingling, and pain.  Quick physical exam today showed a negative straight leg raise on the right. She does have a slightly positive Tinel's over the fibular head. She has 4+/5 strength with resisted ankle dorsiflexion on the right compared to the left. No atrophy. Sensation is intact to light touch grossly.  At this point I think we should pursue an EMG/nerve conduction study to differentiate between lumbar radiculopathy and possible peroneal nerve entrapment at the fibular head. Further workup and treatment will depend on those results and I will contact her via telephone once I reviewed those studies.

## 2014-03-15 ENCOUNTER — Other Ambulatory Visit: Payer: Self-pay | Admitting: *Deleted

## 2014-03-15 ENCOUNTER — Telehealth: Payer: Self-pay | Admitting: Sports Medicine

## 2014-03-15 DIAGNOSIS — M5430 Sciatica, unspecified side: Secondary | ICD-10-CM

## 2014-03-15 NOTE — Telephone Encounter (Signed)
I spoke with the patient on the phone today after reviewing the EMG/nerve conduction study done at [facility]. That study has findings consistent with lumbar radiculopathy affecting the right L5 and S1 levels. This correlates with her symptoms. An MRI scan done in January of 2015 showed moderate foraminal narrowing at L5-S1 bilaterally. Right-sided foramen at L4-L5 appears to be fairly open. She has had a positive response to epidural steroid injections in the past and would like to go ahead and repeat those. She has a good understanding of the exercises that she needs to be doing. I will go ahead and order another lumbar ESI. If symptoms become more aggravating we could consider neurosurgical referral.

## 2014-03-18 ENCOUNTER — Encounter: Payer: Self-pay | Admitting: Sports Medicine

## 2014-04-16 ENCOUNTER — Ambulatory Visit
Admission: RE | Admit: 2014-04-16 | Discharge: 2014-04-16 | Disposition: A | Discharge status: Home / Self Care | Payer: 59 | Source: Ambulatory Visit | Attending: Sports Medicine | Admitting: Sports Medicine

## 2014-04-16 VITALS — BP 132/75 | HR 60

## 2014-04-16 DIAGNOSIS — M5430 Sciatica, unspecified side: Secondary | ICD-10-CM

## 2014-04-16 MED ORDER — METHYLPREDNISOLONE ACETATE 40 MG/ML INJ SUSP (RADIOLOG
120.0000 mg | Freq: Once | INTRAMUSCULAR | Status: AC
  Administered 2014-04-16: 120 mg via EPIDURAL

## 2014-04-16 MED ORDER — IOHEXOL 180 MG/ML  SOLN
1.0000 mL | Freq: Once | INTRAMUSCULAR | Status: AC | PRN
  Administered 2014-04-16: 1 mL via EPIDURAL

## 2014-08-21 ENCOUNTER — Other Ambulatory Visit (HOSPITAL_COMMUNITY): Payer: Self-pay | Admitting: Internal Medicine

## 2014-08-21 DIAGNOSIS — Z1231 Encounter for screening mammogram for malignant neoplasm of breast: Secondary | ICD-10-CM

## 2014-08-26 ENCOUNTER — Ambulatory Visit (HOSPITAL_COMMUNITY)
Admission: RE | Admit: 2014-08-26 | Discharge: 2014-08-26 | Disposition: A | Discharge status: Home / Self Care | Payer: 59 | Source: Ambulatory Visit | Attending: Internal Medicine | Admitting: Internal Medicine

## 2014-08-26 DIAGNOSIS — Z1231 Encounter for screening mammogram for malignant neoplasm of breast: Secondary | ICD-10-CM | POA: Insufficient documentation

## 2014-09-02 ENCOUNTER — Ambulatory Visit: Payer: 59 | Admitting: Nurse Practitioner

## 2014-09-02 ENCOUNTER — Ambulatory Visit (INDEPENDENT_AMBULATORY_CARE_PROVIDER_SITE_OTHER): Payer: 59 | Admitting: Nurse Practitioner

## 2014-09-02 ENCOUNTER — Encounter: Payer: Self-pay | Admitting: Nurse Practitioner

## 2014-09-02 VITALS — BP 122/76 | HR 72 | Ht 66.0 in | Wt 138.0 lb

## 2014-09-02 DIAGNOSIS — Z0000 Encounter for general adult medical examination without abnormal findings: Secondary | ICD-10-CM

## 2014-09-02 DIAGNOSIS — Z01419 Encounter for gynecological examination (general) (routine) without abnormal findings: Secondary | ICD-10-CM

## 2014-09-02 NOTE — Patient Instructions (Signed)

## 2014-09-02 NOTE — Progress Notes (Signed)
Patient ID: Donna Ferrell, female   DOB: 1961/04/16, 54 y.o.   MRN: [id number] 54 y.o. G0P0 Significant Other  Caucasian Fe here for annual exam.  This past year menses in January, August, September, and October 2015.  All of the last 3 cyckes were light and only lasting 1-3 days.  Amenorrhea since.  1 hot flash, several night sweats since last April.  No other vaso symptom until 1 time in January.  No vaginal dryness, no mood changes, no decrease in libido.  This summer going to [place] on a bike trip.  Still has some problems with her lower back but better over all since last here.  Patient's last menstrual period was 04/18/2014 (approximate).          Sexually active: yes  The current method of family planning is none. Same sex partner. Exercising: yes Cycling and walking, also lifts weights Smoker: no  Health Maintenance: Pap: 08/23/11, WNL, neg HR HPV MMG: 08/26/14, 3D, Bi-Rads 1: negative Colonoscopy: 3/12//2013, repeat in 10 years BMD: 2010 normal TDaP: 08/02/11 Labs:  PCP 07/2014   reports that she has never smoked. She has never used smokeless tobacco. She reports that she drinks about 1.0 - 1.5 oz of alcohol per week. She reports that she does not use illicit drugs.  Past Medical History  Diagnosis Date  . History of abnormal cervical Pap smear 12/92    CIN I, LEEP  . Occipital neuralgia 2004  . Osteopenia     had hip jury 2005 with initial BMD at osteopenia then follow up was normal  . GERD (gastroesophageal reflux disease)   . Depression   . Bulging disc 08/21/13    L 5 - S 1 had epidural cotisone 08/23/13    Past Surgical History  Procedure Laterality Date  . Wisdom tooth extraction  1993    Current Outpatient Prescriptions  Medication Sig Dispense Refill  . buPROPion (WELLBUTRIN XL) 300 MG 24 hr tablet Take 300 mg by mouth daily.    . cholecalciferol (VITAMIN D) 1000 UNITS tablet Take 2,000 Units by mouth daily.    . meloxicam (MOBIC) 15 MG tablet Take  15 mg by mouth daily as needed.    . Multiple Vitamin (MULTIVITAMIN) tablet Take 1 tablet by mouth daily.    . pantoprazole (PROTONIX) 40 MG tablet Take 40 mg by mouth daily.     No current facility-administered medications for this visit.    Family History  Problem Relation Age of Onset  . Heart disease Mother   . Breast cancer Mother 769  . Diabetes Father   . Heart disease Father   . Hyperlipidemia Sister   . Multiple sclerosis Sister   . Hypertension Sister   . Thyroid disease Sister   . Hyperlipidemia Brother     ROS:  Pertinent items are noted in HPI.  Otherwise, a comprehensive ROS was negative.  Exam:   BP 122/76 mmHg  Pulse 72  Ht 5\' 6"  (1.676 m)  Wt 138 lb (62.596 kg)  BMI 22.28 kg/m2  LMP 04/18/2014 (Approximate) Height: 5\' 6"  (167.6 cm) Ht Readings from Last 3 Encounters:  09/02/14 5\' 6"  (1.676 m)  01/22/14 5\' 6"  (1.676 m)  12/17/13 5\' 6"  (1.676 m)    General appearance: alert, cooperative and appears stated age Head: Normocephalic, without obvious abnormality, atraumatic Neck: no adenopathy, supple, symmetrical, trachea midline and thyroid normal to inspection and palpation Lungs: clear to auscultation bilaterally Breasts: normal appearance, no masses or tenderness Heart: regular  rate and rhythm Abdomen: soft, non-tender; no masses,  no organomegaly Extremities: extremities normal, atraumatic, no cyanosis or edema Skin: Skin color, texture, turgor normal. No rashes or lesions Lymph nodes: Cervical, supraclavicular, and axillary nodes normal. No abnormal inguinal nodes palpated Neurologic: Grossly normal   Pelvic: External genitalia:  no lesions              Urethra:  normal appearing urethra with no masses, tenderness or lesions              Bartholin's and Skene's: normal                 Vagina: normal appearing vagina with normal color and discharge, no lesions              Cervix: anteverted              Pap taken: Yes.   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

## 2014-09-05 LAB — IPS PAP TEST WITH HPV

## 2014-09-08 NOTE — Progress Notes (Signed)
Encounter reviewed by Dr. Brook Silva.  

## 2014-09-16 ENCOUNTER — Other Ambulatory Visit: Payer: Self-pay | Admitting: *Deleted

## 2014-09-16 DIAGNOSIS — M5431 Sciatica, right side: Secondary | ICD-10-CM

## 2014-10-14 ENCOUNTER — Other Ambulatory Visit: Payer: Self-pay | Admitting: Sports Medicine

## 2014-10-14 ENCOUNTER — Ambulatory Visit
Admission: RE | Admit: 2014-10-14 | Discharge: 2014-10-14 | Disposition: A | Discharge status: Home / Self Care | Payer: 59 | Source: Ambulatory Visit | Attending: Sports Medicine | Admitting: Sports Medicine

## 2014-10-14 DIAGNOSIS — M5431 Sciatica, right side: Secondary | ICD-10-CM

## 2014-10-14 MED ORDER — METHYLPREDNISOLONE ACETATE 40 MG/ML INJ SUSP (RADIOLOG
120.0000 mg | Freq: Once | INTRAMUSCULAR | Status: AC
  Administered 2014-10-14: 120 mg via EPIDURAL

## 2014-10-14 MED ORDER — IOHEXOL 180 MG/ML  SOLN
1.0000 mL | Freq: Once | INTRAMUSCULAR | Status: AC | PRN
Start: 2014-10-14 — End: 2014-10-14
  Administered 2014-10-14: 1 mL via EPIDURAL

## 2014-10-14 NOTE — Discharge Instructions (Signed)

## 2014-10-28 ENCOUNTER — Ambulatory Visit: Payer: 59 | Admitting: Sports Medicine

## 2014-11-04 ENCOUNTER — Other Ambulatory Visit: Payer: Self-pay | Admitting: *Deleted

## 2014-11-04 DIAGNOSIS — M5431 Sciatica, right side: Secondary | ICD-10-CM

## 2014-11-06 ENCOUNTER — Other Ambulatory Visit: Payer: Self-pay | Admitting: Sports Medicine

## 2014-11-06 DIAGNOSIS — M5431 Sciatica, right side: Secondary | ICD-10-CM

## 2014-11-15 ENCOUNTER — Other Ambulatory Visit: Payer: 59

## 2014-11-20 ENCOUNTER — Encounter: Payer: Self-pay | Admitting: Sports Medicine

## 2014-11-20 ENCOUNTER — Ambulatory Visit (INDEPENDENT_AMBULATORY_CARE_PROVIDER_SITE_OTHER): Payer: 59 | Admitting: Sports Medicine

## 2014-11-20 ENCOUNTER — Ambulatory Visit
Admission: RE | Admit: 2014-11-20 | Discharge: 2014-11-20 | Disposition: A | Discharge status: Home / Self Care | Payer: 59 | Source: Ambulatory Visit | Attending: Sports Medicine | Admitting: Sports Medicine

## 2014-11-20 VITALS — BP 154/91 | HR 49 | Ht 66.0 in | Wt 131.0 lb

## 2014-11-20 DIAGNOSIS — M25561 Pain in right knee: Secondary | ICD-10-CM

## 2014-11-20 DIAGNOSIS — M5431 Sciatica, right side: Secondary | ICD-10-CM

## 2014-11-20 DIAGNOSIS — R2240 Localized swelling, mass and lump, unspecified lower limb: Secondary | ICD-10-CM

## 2014-11-20 MED ORDER — METHYLPREDNISOLONE ACETATE 40 MG/ML INJ SUSP (RADIOLOG
120.0000 mg | Freq: Once | INTRAMUSCULAR | Status: AC
  Administered 2014-11-20: 120 mg via EPIDURAL

## 2014-11-20 MED ORDER — IOHEXOL 180 MG/ML  SOLN
1.0000 mL | Freq: Once | INTRAMUSCULAR | Status: AC | PRN
Start: 2014-11-20 — End: 2014-11-20
  Administered 2014-11-20: 1 mL via EPIDURAL

## 2014-11-20 NOTE — Discharge Instructions (Signed)

## 2014-11-20 NOTE — Patient Instructions (Signed)
[facility] Doppler Studies Thurs May 5th 12pm Register in Admitting in the [place] [facility] [phone]

## 2014-11-21 ENCOUNTER — Ambulatory Visit (HOSPITAL_COMMUNITY)
Admission: RE | Admit: 2014-11-21 | Discharge: 2014-11-21 | Disposition: A | Discharge status: Home / Self Care | Payer: 59 | Source: Ambulatory Visit | Attending: Sports Medicine | Admitting: Sports Medicine

## 2014-11-21 DIAGNOSIS — M25561 Pain in right knee: Secondary | ICD-10-CM | POA: Diagnosis present

## 2014-11-21 NOTE — Progress Notes (Signed)
   Subjective:    Patient ID: Donna Ferrell, female    DOB: 11-24-1960, 54 y.o.   MRN: [id number]  HPI chief complaint: Right lower leg swelling  Patient comes in today complaining of 3 weeks of persistent right lower leg swelling. No trauma. She does have some mild pain in this leg but it is from her lumbar radiculopathy. In fact she underwent a repeat right-sided L5 nerve root block and epidural steroid injection earlier today. She noticed the swelling in her leg developed after she returned from a trip to [place]. She has no personal history of prior DVT and no history of cancer but there is a strong family history of DVT in other members of her family including her mother. She is getting only mild calf pain. Denies pain in her knee.    Review of Systems     Objective:   Physical Exam Well-developed, well-nourished. No acute distress. Awake alert and oriented 3. Vital signs reviewed.  Right lower leg does demonstrate some mild diffuse swelling when compared to the left leg. No calf tenderness to palpation. Negative Homans. Neurological exam shows 4/5 strength with resisted great toe extension on the right compared to the left. Otherwise strength is 5/5 in both lower extremities and I do not appreciate any significant atrophy.       Assessment & Plan:  Right lower leg swelling-rule out DVT Right leg radiculopathy secondary to lumbar degenerative disc disease  I will order a Doppler to rule out a DVT and a Baker's cyst. If unremarkable, then her swelling may in fact be due to her radiculopathy as you can see swelling sometimes as a result of neuropathic changes. I will call her with the results of her Doppler once available at which point we will delineate further treatment. In regards to her returning right leg radiculopathy we will see how she responds to her repeat lumbar ESI. She denies any significant weakness in the right leg although she has begun to notice some mild  ankle instability which may or may not be related. If this worsens or she begins to develop weakness then I would consider reimaging her lumbar spine.

## 2014-11-21 NOTE — Progress Notes (Signed)
VASCULAR LAB PRELIMINARY  PRELIMINARY  PRELIMINARY  PRELIMINARY  Right lower extremity venous duplex completed.    Preliminary report:  Right:  No evidence of DVT, superficial thrombosis, or Baker's cyst.  Evann Erazo, RVS 11/21/2014, 12:52 PM

## 2014-11-22 ENCOUNTER — Telehealth: Payer: Self-pay | Admitting: Sports Medicine

## 2014-11-22 NOTE — Telephone Encounter (Signed)
I spoke with the patient on the phone today regarding the Doppler ultrasound that she had done on her lower extremity. Ultrasound is unremarkable. No evidence of DVT or Baker's cyst. I recommended that she get some compression hose or compression stockings and follow-up with me in 3 weeks. She may continue with activity as tolerated in the meantime.

## 2014-12-17 ENCOUNTER — Encounter: Payer: Self-pay | Admitting: Sports Medicine

## 2014-12-17 ENCOUNTER — Ambulatory Visit (INDEPENDENT_AMBULATORY_CARE_PROVIDER_SITE_OTHER): Payer: 59 | Admitting: Sports Medicine

## 2014-12-17 VITALS — BP 126/80

## 2014-12-17 DIAGNOSIS — M5416 Radiculopathy, lumbar region: Secondary | ICD-10-CM

## 2014-12-17 NOTE — Progress Notes (Signed)
   Subjective:    Patient ID: Donna Ferrell, female    DOB: Jan 05, 1961, 54 y.o.   MRN: [id number]  HPI   Patient comes in today for follow-up on right lower leg swelling. A Doppler of her lower leg was negative for DVT. No Baker's cyst. She has been wearing compression socks which seem to be helping. She is also here to follow-up on right lower leg radiculopathy. Unfortunately she has not received the same response to lumbar epidural steroidal injections that she has received in the past. She is still complaining of pain along the lateral knee with radiating discomfort into the anterior lower leg and into the dorsum of her foot. She is also noticing some weakness. She has noticed that all of this has worsened since a recent fall.    Review of Systems     Objective:   Physical Exam Well-developed, well-nourished. No acute distress. Awake alert and oriented 3. Vital signs reviewed  Positive straight leg raise on the right, negative on the left. Neurological exam shows 4/5 strength with resisted great toe extension on the right compared to 5/5 on the left. She has 4+/5 strength with resisted dorsiflexion on the right compared to 5/5 on the left. Good plantar flexion bilaterally. Trace but equal reflexes at the patellar and Achilles tendons.  Previous right lower extremity edema has resolved. In fact, her right calf measures 35.5 cm which is smaller than the (37 cm)       Assessment & Plan:  Resolved right lower extremity edema Worsening right leg radiculopathy status post fall  Given her persistent symptoms despite repeat lumbar ESI's and her weakness on exam I recommended that we get an updated MRI of her lumbar spine specifically to see if she has any worsening disc herniation. Phone follow-up after those results to delineate further treatment.

## 2014-12-23 ENCOUNTER — Other Ambulatory Visit: Payer: 59

## 2014-12-23 NOTE — Patient Instructions (Signed)
MRI PA Number: [id number]

## 2014-12-24 ENCOUNTER — Ambulatory Visit
Admission: RE | Admit: 2014-12-24 | Discharge: 2014-12-24 | Disposition: A | Discharge status: Home / Self Care | Payer: 59 | Source: Ambulatory Visit | Attending: Sports Medicine | Admitting: Sports Medicine

## 2014-12-24 DIAGNOSIS — M5416 Radiculopathy, lumbar region: Secondary | ICD-10-CM

## 2014-12-30 ENCOUNTER — Telehealth: Payer: Self-pay | Admitting: Sports Medicine

## 2014-12-30 DIAGNOSIS — M5431 Sciatica, right side: Secondary | ICD-10-CM

## 2014-12-30 DIAGNOSIS — M48061 Spinal stenosis, lumbar region without neurogenic claudication: Secondary | ICD-10-CM

## 2014-12-30 NOTE — Telephone Encounter (Signed)
I spoke with the patient on the phone today after reviewing the MRI of her lumbar spine. MRI is compared to previous MRI done in January 2015. She now has severe right foraminal stenosis at L5-S1. This correlates with her symptoms. Given her increasing weakness and failure to improve with lumbar epidural steroidal injections I've recommended neurosurgical consultation with Dr. Newell Coral to discuss further treatment options. I will also have another one of the musculoskeletal radiologists review her MRI to see if they are in agreement with the original reading. At this point I will plan on deferring all further workup and treatment to Dr. Newell Coral.

## 2014-12-30 NOTE — Addendum Note (Signed)
Addended by: Annita Brod on: 12/30/2014 10:36 AM   Modules accepted: Orders

## 2015-01-03 DIAGNOSIS — M51369 Other intervertebral disc degeneration, lumbar region without mention of lumbar back pain or lower extremity pain: Secondary | ICD-10-CM | POA: Insufficient documentation

## 2015-01-03 DIAGNOSIS — M47816 Spondylosis without myelopathy or radiculopathy, lumbar region: Secondary | ICD-10-CM | POA: Insufficient documentation

## 2015-02-03 ENCOUNTER — Encounter: Payer: Self-pay | Admitting: Sports Medicine

## 2015-06-27 ENCOUNTER — Other Ambulatory Visit: Payer: Self-pay | Admitting: *Deleted

## 2015-06-27 DIAGNOSIS — M48061 Spinal stenosis, lumbar region without neurogenic claudication: Secondary | ICD-10-CM

## 2015-08-25 ENCOUNTER — Other Ambulatory Visit: Payer: Self-pay

## 2015-08-25 DIAGNOSIS — Z1231 Encounter for screening mammogram for malignant neoplasm of breast: Secondary | ICD-10-CM

## 2015-09-08 ENCOUNTER — Encounter: Payer: Self-pay | Admitting: Nurse Practitioner

## 2015-09-08 ENCOUNTER — Ambulatory Visit (INDEPENDENT_AMBULATORY_CARE_PROVIDER_SITE_OTHER): Payer: 59 | Admitting: Nurse Practitioner

## 2015-09-08 VITALS — BP 130/84 | HR 68 | Ht 65.5 in | Wt 128.0 lb

## 2015-09-08 DIAGNOSIS — Z01419 Encounter for gynecological examination (general) (routine) without abnormal findings: Secondary | ICD-10-CM | POA: Diagnosis not present

## 2015-09-08 DIAGNOSIS — Z0000 Encounter for general adult medical examination without abnormal findings: Secondary | ICD-10-CM

## 2015-09-08 DIAGNOSIS — E2839 Other primary ovarian failure: Secondary | ICD-10-CM
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
## 2015-09-08 NOTE — Patient Instructions (Signed)

## 2015-09-09 ENCOUNTER — Ambulatory Visit: Payer: 59

## 2015-09-10 NOTE — Progress Notes (Signed)
Encounter reviewed by Dr. Brook Amundson C. Silva.  

## 2015-09-15 ENCOUNTER — Telehealth: Payer: Self-pay | Admitting: Emergency Medicine

## 2015-09-15 ENCOUNTER — Encounter: Payer: Self-pay | Admitting: Nurse Practitioner

## 2015-09-15 NOTE — Telephone Encounter (Signed)
Routing mychart message to provider. Will close message.

## 2015-09-15 NOTE — Telephone Encounter (Signed)
Chief Complaint  Patient presents with  . Advice Only    Patient sent mychart message    Ms. Hermelinda Medicus,  Thank you for letting us know about your bone density and mammogram appointment. I will send your message to [facility] and she will be very pleased.   Sincerely,   Almedia Balls, BSN RN-BC Triage Nurse [facility] [address], [address] [place], [place] [address] [facility]: [phone]; Fax: [phone]   ----- Message ----- From: Darlin Coco Sent: 09/15/2015 11:06 AM EST To: Lauro Franklin, FNP Subject: Non-Urgent Medical Question  Hi,  i have rescheduled my mammogram for in order to include the bone density during the same visit. I need to stop my vitamin supplement for a couple of days and couldn't have the bone density last week.  best regards, barb Przybylski

## 2015-09-26 ENCOUNTER — Ambulatory Visit
Admission: RE | Admit: 2015-09-26 | Discharge: 2015-09-26 | Disposition: A | Discharge status: Home / Self Care | Payer: 59 | Source: Ambulatory Visit | Attending: Nurse Practitioner | Admitting: Nurse Practitioner

## 2015-09-26 ENCOUNTER — Ambulatory Visit
Admission: RE | Admit: 2015-09-26 | Discharge: 2015-09-26 | Disposition: A | Discharge status: Home / Self Care | Payer: 59 | Source: Ambulatory Visit

## 2015-09-26 DIAGNOSIS — Z1231 Encounter for screening mammogram for malignant neoplasm of breast: Secondary | ICD-10-CM

## 2015-09-26 DIAGNOSIS — E2839 Other primary ovarian failure: Secondary | ICD-10-CM

## 2015-11-28 ENCOUNTER — Other Ambulatory Visit: Payer: Self-pay | Admitting: Neurosurgery

## 2016-02-06 NOTE — Pre-Procedure Instructions (Signed)
    Donna Ferrell  02/06/2016      [facility] [phone] [place] - [address] [name] [place] [id number] Phone: [phone] Fax: [phone]    Your procedure is scheduled on  Monday, July 31st.               [facility] at 5:30 AM.             (Posted surgery time 7:30 - 11:40 am)   Call this number if you have problems the morning of surgery:  [phone]   Remember:  Do not eat food or drink liquids after midnight Sunday.   Take these medicines the morning of surgery with A SIP OF WATER : Xanax, Wellbutrin, Protonix   Do not wear jewelry, make-up or nail polish.  Do not wear lotions, powders, or perfumes.     Do not shave underarms & legs 48 hours prior to surgery.    Do not bring valuables to the hospital.  [facility] is not responsible for any belongings or valuables.  Contacts, dentures or bridgework may not be worn into surgery.  Leave your suitcase in the car.  After surgery it may be brought to your room. For patients admitted to the hospital, discharge time will be determined by your treatment team.  Name and phone number of your driver:     Please read over the following fact sheets that you were given. Pain Booklet and MRSA Information

## 2016-02-09 ENCOUNTER — Encounter (HOSPITAL_COMMUNITY): Payer: Self-pay

## 2016-02-09 ENCOUNTER — Encounter (HOSPITAL_COMMUNITY)
Admission: RE | Admit: 2016-02-09 | Discharge: 2016-02-09 | Disposition: A | Discharge status: Home / Self Care | Payer: 59 | Source: Ambulatory Visit | Attending: Neurosurgery | Admitting: Neurosurgery

## 2016-02-09 DIAGNOSIS — Z01812 Encounter for preprocedural laboratory examination: Secondary | ICD-10-CM | POA: Diagnosis not present

## 2016-02-09 DIAGNOSIS — M4806 Spinal stenosis, lumbar region: Secondary | ICD-10-CM | POA: Diagnosis not present

## 2016-02-09 DIAGNOSIS — Z0183 Encounter for blood typing: Secondary | ICD-10-CM | POA: Diagnosis not present

## 2016-02-09 HISTORY — DX: Unspecified osteoarthritis, unspecified site: M19.90

## 2016-02-09 HISTORY — DX: Anxiety disorder, unspecified: F41.9

## 2016-02-09 LAB — BASIC METABOLIC PANEL
Anion gap: 7 (ref 5–15)
BUN: 20 mg/dL (ref 6–20)
CHLORIDE: 106 mmol/L (ref 101–111)
CO2: 26 mmol/L (ref 22–32)
CREATININE: 0.86 mg/dL (ref 0.44–1.00)
Calcium: 9.4 mg/dL (ref 8.9–10.3)
GFR calc Af Amer: >60 mL/min (ref >60)
GFR calc non Af Amer: >60 mL/min (ref >60)
GLUCOSE: 80 mg/dL (ref 65–99)
Potassium: 3.7 mmol/L (ref 3.5–5.1)
SODIUM: 139 mmol/L (ref 135–145)

## 2016-02-09 LAB — ABO/RH: ABO/RH(D): A POS

## 2016-02-09 LAB — TYPE AND SCREEN
ABO/RH(D): A POS
Antibody Screen: NEGATIVE

## 2016-02-09 LAB — SURGICAL PCR SCREEN
MRSA, PCR: NEGATIVE
Staphylococcus aureus: NEGATIVE

## 2016-02-09 LAB — CBC
HCT: 41 % (ref 36.0–46.0)
HEMOGLOBIN: 13.3 g/dL (ref 12.0–15.0)
MCH: 30 pg (ref 26.0–34.0)
MCHC: 32.4 g/dL (ref 30.0–36.0)
MCV: 92.3 fL (ref 78.0–100.0)
Platelets: 179 10*3/uL (ref 150–400)
RBC: 4.44 MIL/uL (ref 3.87–5.11)
RDW: 13.2 % (ref 11.5–15.5)
WBC: 4.6 10*3/uL (ref 4.0–10.5)

## 2016-02-15 ENCOUNTER — Encounter (HOSPITAL_COMMUNITY): Payer: Self-pay | Admitting: Anesthesiology

## 2016-02-15 MED ORDER — CEFAZOLIN SODIUM-DEXTROSE 2-4 GM/100ML-% IV SOLN
2.0000 g | INTRAVENOUS | Status: AC
  Administered 2016-02-16 (×2): 2 g via INTRAVENOUS
  Filled 2016-02-15: qty 100

## 2016-02-15 NOTE — Anesthesia Preprocedure Evaluation (Addendum)
Anesthesia Evaluation  Patient identified by MRN, date of birth, ID band Patient awake    Reviewed: Allergy & Precautions, NPO status , Patient's Chart, lab work & pertinent test results  Airway Mallampati: I       Dental no notable dental hx. (+) Teeth Intact   Pulmonary    Pulmonary exam normal        Cardiovascular Normal cardiovascular exam     Neuro/Psych    GI/Hepatic GERD  Medicated and Controlled,  Endo/Other    Renal/GU      Musculoskeletal   Abdominal Normal abdominal exam  (+)   Peds  Hematology   Anesthesia Other Findings   Reproductive/Obstetrics                          Anesthesia Physical Anesthesia Plan  ASA: II  Anesthesia Plan: General   Post-op Pain Management:    Induction: Intravenous  Airway Management Planned: Oral ETT  Additional Equipment:   Intra-op Plan:   Post-operative Plan: Extubation in OR  Informed Consent: I have reviewed the patients History and Physical, chart, labs and discussed the procedure including the risks, benefits and alternatives for the proposed anesthesia with the patient or authorized representative who has indicated his/her understanding and acceptance.   Dental advisory given  Plan Discussed with: CRNA and Surgeon  Anesthesia Plan Comments:        Anesthesia Quick Evaluation

## 2016-02-16 ENCOUNTER — Encounter (HOSPITAL_COMMUNITY)
Admission: RE | Disposition: A | Discharge status: Home / Self Care | Payer: Self-pay | Source: Ambulatory Visit | Attending: Neurosurgery

## 2016-02-16 ENCOUNTER — Observation Stay (HOSPITAL_COMMUNITY)
Admission: RE | Admit: 2016-02-16 | Discharge: 2016-02-17 | Disposition: A | Discharge status: Home / Self Care | Payer: 59 | Source: Ambulatory Visit | Attending: Neurosurgery | Admitting: Neurosurgery

## 2016-02-16 ENCOUNTER — Inpatient Hospital Stay (HOSPITAL_COMMUNITY): Payer: 59 | Admitting: Anesthesiology

## 2016-02-16 ENCOUNTER — Observation Stay (HOSPITAL_COMMUNITY): Payer: 59

## 2016-02-16 ENCOUNTER — Encounter (HOSPITAL_COMMUNITY): Payer: Self-pay | Admitting: Anesthesiology

## 2016-02-16 DIAGNOSIS — K219 Gastro-esophageal reflux disease without esophagitis: Secondary | ICD-10-CM | POA: Diagnosis not present

## 2016-02-16 DIAGNOSIS — M5116 Intervertebral disc disorders with radiculopathy, lumbar region: Secondary | ICD-10-CM | POA: Diagnosis not present

## 2016-02-16 DIAGNOSIS — M48062 Spinal stenosis, lumbar region with neurogenic claudication: Secondary | ICD-10-CM | POA: Diagnosis present

## 2016-02-16 DIAGNOSIS — Z419 Encounter for procedure for purposes other than remedying health state, unspecified: Secondary | ICD-10-CM

## 2016-02-16 DIAGNOSIS — M4807 Spinal stenosis, lumbosacral region: Secondary | ICD-10-CM | POA: Diagnosis present

## 2016-02-16 HISTORY — PX: OTHER SURGICAL HISTORY: SHX169

## 2016-02-16 SURGERY — POSTERIOR LUMBAR FUSION 1 LEVEL
Anesthesia: General | Site: Back | Laterality: Right

## 2016-02-16 MED ORDER — PHENYLEPHRINE HCL 10 MG/ML IJ SOLN
INTRAMUSCULAR | Status: DC | PRN
  Administered 2016-02-16: 120 ug via INTRAVENOUS
  Administered 2016-02-16: 40 ug via INTRAVENOUS

## 2016-02-16 MED ORDER — LIDOCAINE-EPINEPHRINE 1 %-1:100000 IJ SOLN
INTRAMUSCULAR | Status: DC | PRN
  Administered 2016-02-16: 20 mL

## 2016-02-16 MED ORDER — ALUM & MAG HYDROXIDE-SIMETH 200-200-20 MG/5ML PO SUSP: 30.0000 mL | Freq: Four times a day (QID) | ORAL | Status: DC | PRN

## 2016-02-16 MED ORDER — ACETAMINOPHEN 10 MG/ML IV SOLN
INTRAVENOUS | Status: AC
  Filled 2016-02-16: qty 100

## 2016-02-16 MED ORDER — CYCLOBENZAPRINE HCL 10 MG PO TABS
10.0000 mg | ORAL_TABLET | Freq: Three times a day (TID) | ORAL | Status: DC | PRN
  Administered 2016-02-16 (×2): 10 mg via ORAL
  Filled 2016-02-16 (×2): qty 1

## 2016-02-16 MED ORDER — KETOROLAC TROMETHAMINE 30 MG/ML IJ SOLN
30.0000 mg | Freq: Once | INTRAMUSCULAR | Status: AC
  Administered 2016-02-16: 30 mg via INTRAVENOUS

## 2016-02-16 MED ORDER — DEXAMETHASONE SODIUM PHOSPHATE 10 MG/ML IJ SOLN
INTRAMUSCULAR | Status: AC
  Filled 2016-02-16: qty 1

## 2016-02-16 MED ORDER — SUCCINYLCHOLINE CHLORIDE 200 MG/10ML IV SOSY
PREFILLED_SYRINGE | INTRAVENOUS | Status: AC
  Filled 2016-02-16: qty 10

## 2016-02-16 MED ORDER — PROMETHAZINE HCL 25 MG/ML IJ SOLN: 6.2500 mg | INTRAMUSCULAR | Status: DC | PRN

## 2016-02-16 MED ORDER — SODIUM CHLORIDE 0.9% FLUSH: 3.0000 mL | Freq: Two times a day (BID) | INTRAVENOUS | Status: DC

## 2016-02-16 MED ORDER — KCL IN DEXTROSE-NACL 20-5-0.45 MEQ/L-%-% IV SOLN: INTRAVENOUS | Status: DC

## 2016-02-16 MED ORDER — CEFAZOLIN SODIUM 1 G IJ SOLR
INTRAMUSCULAR | Status: AC
  Filled 2016-02-16: qty 20

## 2016-02-16 MED ORDER — HYDROMORPHONE HCL 1 MG/ML IJ SOLN
0.2500 mg | INTRAMUSCULAR | Status: DC | PRN
  Administered 2016-02-16 (×2): 0.5 mg via INTRAVENOUS

## 2016-02-16 MED ORDER — SODIUM CHLORIDE 0.9 % IR SOLN
Status: DC | PRN
  Administered 2016-02-16: 500 mL

## 2016-02-16 MED ORDER — OXYCODONE-ACETAMINOPHEN 5-325 MG PO TABS: 1.0000 | ORAL_TABLET | ORAL | Status: DC | PRN

## 2016-02-16 MED ORDER — MIDAZOLAM HCL 5 MG/5ML IJ SOLN
INTRAMUSCULAR | Status: DC | PRN
  Administered 2016-02-16: 2 mg via INTRAVENOUS

## 2016-02-16 MED ORDER — MIDAZOLAM HCL 2 MG/2ML IJ SOLN
INTRAMUSCULAR | Status: AC
  Filled 2016-02-16: qty 2

## 2016-02-16 MED ORDER — ACETAMINOPHEN 650 MG RE SUPP: 650.0000 mg | RECTAL | Status: DC | PRN

## 2016-02-16 MED ORDER — LIDOCAINE 2% (20 MG/ML) 5 ML SYRINGE
INTRAMUSCULAR | Status: AC
  Filled 2016-02-16: qty 5

## 2016-02-16 MED ORDER — HYDROCODONE-ACETAMINOPHEN 5-325 MG PO TABS
1.0000 | ORAL_TABLET | ORAL | Status: DC | PRN
  Administered 2016-02-16 – 2016-02-17 (×3): 2 via ORAL
  Filled 2016-02-16 (×3): qty 2

## 2016-02-16 MED ORDER — BUPIVACAINE HCL (PF) 0.5 % IJ SOLN
INTRAMUSCULAR | Status: DC | PRN
  Administered 2016-02-16 (×2): 30 mL

## 2016-02-16 MED ORDER — KETOROLAC TROMETHAMINE 30 MG/ML IJ SOLN
30.0000 mg | Freq: Four times a day (QID) | INTRAMUSCULAR | Status: DC
  Administered 2016-02-16 – 2016-02-17 (×3): 30 mg via INTRAVENOUS
  Filled 2016-02-16 (×3): qty 1

## 2016-02-16 MED ORDER — FENTANYL CITRATE (PF) 250 MCG/5ML IJ SOLN
INTRAMUSCULAR | Status: AC
  Filled 2016-02-16: qty 5

## 2016-02-16 MED ORDER — DEXAMETHASONE SODIUM PHOSPHATE 10 MG/ML IJ SOLN
INTRAMUSCULAR | Status: DC | PRN
  Administered 2016-02-16: 10 mg via INTRAVENOUS

## 2016-02-16 MED ORDER — MAGNESIUM HYDROXIDE 400 MG/5ML PO SUSP: 30.0000 mL | Freq: Every day | ORAL | Status: DC | PRN

## 2016-02-16 MED ORDER — ONDANSETRON HCL 4 MG/2ML IJ SOLN
INTRAMUSCULAR | Status: AC
  Filled 2016-02-16: qty 2

## 2016-02-16 MED ORDER — THROMBIN 5000 UNITS EX SOLR
OROMUCOSAL | Status: DC | PRN
  Administered 2016-02-16: 09:00:00 via TOPICAL

## 2016-02-16 MED ORDER — MENTHOL 3 MG MT LOZG: 1.0000 | LOZENGE | OROMUCOSAL | Status: DC | PRN

## 2016-02-16 MED ORDER — ARTIFICIAL TEARS OP OINT
TOPICAL_OINTMENT | OPHTHALMIC | Status: AC
  Filled 2016-02-16: qty 3.5

## 2016-02-16 MED ORDER — ONDANSETRON HCL 4 MG/2ML IJ SOLN: 4.0000 mg | Freq: Four times a day (QID) | INTRAMUSCULAR | Status: DC | PRN

## 2016-02-16 MED ORDER — ALPRAZOLAM 0.5 MG PO TABS: 0.5000 mg | ORAL_TABLET | Freq: Three times a day (TID) | ORAL | Status: DC | PRN

## 2016-02-16 MED ORDER — SODIUM CHLORIDE 0.9% FLUSH: 3.0000 mL | INTRAVENOUS | Status: DC | PRN

## 2016-02-16 MED ORDER — PHENYLEPHRINE 40 MCG/ML (10ML) SYRINGE FOR IV PUSH (FOR BLOOD PRESSURE SUPPORT)
PREFILLED_SYRINGE | INTRAVENOUS | Status: AC
  Filled 2016-02-16: qty 20

## 2016-02-16 MED ORDER — VITAMIN D3 25 MCG (1000 UNIT) PO TABS
2000.0000 [IU] | ORAL_TABLET | Freq: Every day | ORAL | Status: DC
  Administered 2016-02-16: 2000 [IU] via ORAL
  Filled 2016-02-16 (×2): qty 2

## 2016-02-16 MED ORDER — HYDROMORPHONE HCL 1 MG/ML IJ SOLN
INTRAMUSCULAR | Status: AC
  Filled 2016-02-16: qty 1

## 2016-02-16 MED ORDER — PANTOPRAZOLE SODIUM 40 MG PO TBEC: 40.0000 mg | DELAYED_RELEASE_TABLET | Freq: Every day | ORAL | Status: DC

## 2016-02-16 MED ORDER — PHENYLEPHRINE HCL 10 MG/ML IJ SOLN
INTRAVENOUS | Status: DC | PRN
  Administered 2016-02-16: 25 ug/min via INTRAVENOUS

## 2016-02-16 MED ORDER — HYDROXYZINE HCL 25 MG PO TABS: 50.0000 mg | ORAL_TABLET | ORAL | Status: DC | PRN

## 2016-02-16 MED ORDER — ACETAMINOPHEN 10 MG/ML IV SOLN
INTRAVENOUS | Status: DC | PRN
  Administered 2016-02-16: 1000 mg via INTRAVENOUS

## 2016-02-16 MED ORDER — FENTANYL CITRATE (PF) 100 MCG/2ML IJ SOLN
INTRAMUSCULAR | Status: DC | PRN
  Administered 2016-02-16: 150 ug via INTRAVENOUS
  Administered 2016-02-16 (×2): 50 ug via INTRAVENOUS

## 2016-02-16 MED ORDER — 0.9 % SODIUM CHLORIDE (POUR BTL) OPTIME
TOPICAL | Status: DC | PRN
  Administered 2016-02-16 (×2): 1000 mL

## 2016-02-16 MED ORDER — BUPROPION HCL ER (XL) 300 MG PO TB24: 300.0000 mg | ORAL_TABLET | Freq: Every day | ORAL | Status: DC

## 2016-02-16 MED ORDER — PROPOFOL 10 MG/ML IV BOLUS
INTRAVENOUS | Status: DC | PRN
  Administered 2016-02-16: 140 mg via INTRAVENOUS
  Administered 2016-02-16: 20 mg via INTRAVENOUS

## 2016-02-16 MED ORDER — ARTIFICIAL TEARS OP OINT
TOPICAL_OINTMENT | OPHTHALMIC | Status: DC | PRN
  Administered 2016-02-16: 1 via OPHTHALMIC

## 2016-02-16 MED ORDER — MEPERIDINE HCL 25 MG/ML IJ SOLN: 6.2500 mg | INTRAMUSCULAR | Status: DC | PRN

## 2016-02-16 MED ORDER — SUCCINYLCHOLINE 20MG/ML (10ML) SYRINGE FOR MEDFUSION PUMP - OPTIME
INTRAMUSCULAR | Status: DC | PRN
  Administered 2016-02-16: 100 mg via INTRAVENOUS

## 2016-02-16 MED ORDER — THROMBIN 20000 UNITS EX SOLR
CUTANEOUS | Status: DC | PRN
  Administered 2016-02-16: 09:00:00 via TOPICAL

## 2016-02-16 MED ORDER — SODIUM CHLORIDE 0.9 % IJ SOLN
INTRAMUSCULAR | Status: AC
  Filled 2016-02-16: qty 20

## 2016-02-16 MED ORDER — ACETAMINOPHEN 325 MG PO TABS: 650.0000 mg | ORAL_TABLET | ORAL | Status: DC | PRN

## 2016-02-16 MED ORDER — ZOLPIDEM TARTRATE 5 MG PO TABS: 5.0000 mg | ORAL_TABLET | Freq: Every evening | ORAL | Status: DC | PRN

## 2016-02-16 MED ORDER — ROCURONIUM BROMIDE 50 MG/5ML IV SOLN
INTRAVENOUS | Status: AC
  Filled 2016-02-16: qty 1

## 2016-02-16 MED ORDER — ONDANSETRON HCL 4 MG/2ML IJ SOLN
INTRAMUSCULAR | Status: DC | PRN
Start: 2016-02-16 — End: 2016-02-16
  Administered 2016-02-16: 4 mg via INTRAVENOUS

## 2016-02-16 MED ORDER — SODIUM CHLORIDE 0.9 % IV SOLN: 250.0000 mL | INTRAVENOUS | Status: DC

## 2016-02-16 MED ORDER — MORPHINE SULFATE (PF) 4 MG/ML IV SOLN: 4.0000 mg | INTRAVENOUS | Status: DC | PRN

## 2016-02-16 MED ORDER — LACTATED RINGERS IV SOLN
INTRAVENOUS | Status: DC | PRN
  Administered 2016-02-16 (×2): via INTRAVENOUS

## 2016-02-16 MED ORDER — KETOROLAC TROMETHAMINE 30 MG/ML IJ SOLN: 30.0000 mg | Freq: Once | INTRAMUSCULAR | Status: DC

## 2016-02-16 MED ORDER — BISACODYL 10 MG RE SUPP: 10.0000 mg | Freq: Every day | RECTAL | Status: DC | PRN

## 2016-02-16 MED ORDER — LIDOCAINE HCL (CARDIAC) 20 MG/ML IV SOLN
INTRAVENOUS | Status: DC | PRN
  Administered 2016-02-16: 100 mg via INTRAVENOUS

## 2016-02-16 MED ORDER — HYDROXYZINE HCL 50 MG/ML IM SOLN: 50.0000 mg | INTRAMUSCULAR | Status: DC | PRN

## 2016-02-16 MED ORDER — PROPOFOL 10 MG/ML IV BOLUS
INTRAVENOUS | Status: AC
  Filled 2016-02-16: qty 40

## 2016-02-16 MED ORDER — KETOROLAC TROMETHAMINE 30 MG/ML IJ SOLN
INTRAMUSCULAR | Status: AC
  Filled 2016-02-16: qty 1

## 2016-02-16 MED ORDER — PHENOL 1.4 % MT LIQD: 1.0000 | OROMUCOSAL | Status: DC | PRN

## 2016-02-16 MED ORDER — ONDANSETRON HCL 4 MG PO TABS: 4.0000 mg | ORAL_TABLET | Freq: Four times a day (QID) | ORAL | Status: DC | PRN

## 2016-02-16 SURGICAL SUPPLY — 80 items
ADH SKN CLS APL DERMABOND .7 (GAUZE/BANDAGES/DRESSINGS) ×1
BAG DECANTER FOR FLEXI CONT (MISCELLANEOUS) ×2 IMPLANT
BIT DRILL PLIF MAS DISP 5.5MM (DRILL) IMPLANT
BLADE CLIPPER SURG (BLADE) IMPLANT
BRUSH SCRUB EZ PLAIN DRY (MISCELLANEOUS) ×2 IMPLANT
BUR ACRON 5.0MM COATED (BURR) ×2 IMPLANT
BUR MATCHSTICK NEURO 3.0 LAGG (BURR) ×2 IMPLANT
CANISTER SUCT 3000ML PPV (MISCELLANEOUS) ×2 IMPLANT
CAP RELINE MOD TULIP RMM (Cap) ×4 IMPLANT
CLIP NEUROVISION LG (CLIP) ×1 IMPLANT
CONT SPEC 4OZ CLIKSEAL STRL BL (MISCELLANEOUS) ×2 IMPLANT
COVER BACK TABLE 60X90IN (DRAPES) ×2 IMPLANT
DERMABOND ADVANCED (GAUZE/BANDAGES/DRESSINGS) ×1
DERMABOND ADVANCED .7 DNX12 (GAUZE/BANDAGES/DRESSINGS) ×2 IMPLANT
DRAPE C-ARM 42X72 X-RAY (DRAPES) ×4 IMPLANT
DRAPE C-ARMOR (DRAPES) ×1 IMPLANT
DRAPE HALF SHEET 40X57 (DRAPES) IMPLANT
DRAPE LAPAROTOMY 100X72X124 (DRAPES) ×2 IMPLANT
DRAPE POUCH INSTRU U-SHP 10X18 (DRAPES) ×2 IMPLANT
DRILL PLIF MAS DISP 5.5MM (DRILL) ×2
DRSG EMULSION OIL 3X3 NADH (GAUZE/BANDAGES/DRESSINGS) IMPLANT
ELECT REM PT RETURN 9FT ADLT (ELECTROSURGICAL) ×2
ELECTRODE REM PT RTRN 9FT ADLT (ELECTROSURGICAL) ×1 IMPLANT
GAUZE SPONGE 4X4 12PLY STRL (GAUZE/BANDAGES/DRESSINGS) ×2 IMPLANT
GAUZE SPONGE 4X4 16PLY XRAY LF (GAUZE/BANDAGES/DRESSINGS) IMPLANT
GLOVE BIOGEL PI IND STRL 6.5 (GLOVE) IMPLANT
GLOVE BIOGEL PI IND STRL 7.0 (GLOVE) IMPLANT
GLOVE BIOGEL PI IND STRL 8 (GLOVE) ×2 IMPLANT
GLOVE BIOGEL PI INDICATOR 6.5 (GLOVE) ×1
GLOVE BIOGEL PI INDICATOR 7.0 (GLOVE) ×2
GLOVE BIOGEL PI INDICATOR 8 (GLOVE) ×3
GLOVE ECLIPSE 7.5 STRL STRAW (GLOVE) ×6 IMPLANT
GLOVE ECLIPSE 9.0 STRL (GLOVE) ×1 IMPLANT
GLOVE SURG SS PI 6.5 STRL IVOR (GLOVE) ×6 IMPLANT
GOWN STRL REUS W/ TWL LRG LVL3 (GOWN DISPOSABLE) ×1 IMPLANT
GOWN STRL REUS W/ TWL XL LVL3 (GOWN DISPOSABLE) ×1 IMPLANT
GOWN STRL REUS W/TWL 2XL LVL3 (GOWN DISPOSABLE) IMPLANT
GOWN STRL REUS W/TWL LRG LVL3 (GOWN DISPOSABLE) ×6
GOWN STRL REUS W/TWL XL LVL3 (GOWN DISPOSABLE) ×8
HEMOSTAT POWDER SURGIFOAM 1G (HEMOSTASIS) ×1 IMPLANT
KIT BASIN OR (CUSTOM PROCEDURE TRAY) ×2 IMPLANT
KIT INFUSE X SMALL 1.4CC (Orthopedic Implant) ×1 IMPLANT
KIT ROOM TURNOVER OR (KITS) ×2 IMPLANT
LIGHT SOURCE ANGLE TIP STR 7FT (MISCELLANEOUS) ×1 IMPLANT
MILL MEDIUM DISP (BLADE) IMPLANT
MODULE NVM5 NEXT GEN EMG (NEEDLE) ×1 IMPLANT
NDL ASP BONE MRW 8GX15 (NEEDLE) IMPLANT
NDL SPNL 18GX3.5 QUINCKE PK (NEEDLE) IMPLANT
NDL SPNL 22GX3.5 QUINCKE BK (NEEDLE) ×2 IMPLANT
NEEDLE ASP BONE MRW 8GX15 (NEEDLE) ×2 IMPLANT
NEEDLE BONE MARROW 8GAX6 (NEEDLE) IMPLANT
NEEDLE SPNL 18GX3.5 QUINCKE PK (NEEDLE) IMPLANT
NEEDLE SPNL 22GX3.5 QUINCKE BK (NEEDLE) ×4 IMPLANT
NS IRRIG 1000ML POUR BTL (IV SOLUTION) ×2 IMPLANT
PACK LAMINECTOMY NEURO (CUSTOM PROCEDURE TRAY) ×2 IMPLANT
PAD ARMBOARD 7.5X6 YLW CONV (MISCELLANEOUS) ×6 IMPLANT
PATTIES SURGICAL .5 X.5 (GAUZE/BANDAGES/DRESSINGS) IMPLANT
PATTIES SURGICAL .5 X1 (DISPOSABLE) IMPLANT
PATTIES SURGICAL 1X1 (DISPOSABLE) IMPLANT
PEEK PLIF AVS 9X25X8 (Peek) ×2 IMPLANT
PROBE BALL TIP NVM5 SNG USE (BALLOONS) ×1 IMPLANT
ROD RELINE COCR LORD 5X30 (Rod) ×2 IMPLANT
SCREW LOCK RSS 4.5/5.0MM (Screw) ×4 IMPLANT
SCREW SHANK RELINE MOD 5.5X30 (Screw) ×2 IMPLANT
SCREW SHANK RELINE MOD 6.5X30 (Screw) ×2 IMPLANT
SCREW SHANK RELINE MOD 6.5X35 (Screw) IMPLANT
SPONGE LAP 4X18 X RAY DECT (DISPOSABLE) IMPLANT
SPONGE NEURO XRAY DETECT 1X3 (DISPOSABLE) IMPLANT
SPONGE SURGIFOAM ABS GEL 100 (HEMOSTASIS) ×2 IMPLANT
STRIP BIOACTIVE VITOSS 25X100X (Neuro Prosthesis/Implant) ×1 IMPLANT
STRIP BIOACTIVE VITOSS 25X52X4 (Orthopedic Implant) ×1 IMPLANT
SUT VIC AB 1 CT1 18XBRD ANBCTR (SUTURE) ×2 IMPLANT
SUT VIC AB 1 CT1 8-18 (SUTURE) ×4
SUT VIC AB 2-0 CP2 18 (SUTURE) ×4 IMPLANT
SYR 3ML LL SCALE MARK (SYRINGE) IMPLANT
SYR CONTROL 10ML LL (SYRINGE) ×2 IMPLANT
TOWEL OR 17X24 6PK STRL BLUE (TOWEL DISPOSABLE) ×2 IMPLANT
TOWEL OR 17X26 10 PK STRL BLUE (TOWEL DISPOSABLE) ×2 IMPLANT
TRAY FOLEY W/METER SILVER 16FR (SET/KITS/TRAYS/PACK) ×2 IMPLANT
WATER STERILE IRR 1000ML POUR (IV SOLUTION) ×2 IMPLANT

## 2016-02-16 NOTE — Anesthesia Postprocedure Evaluation (Signed)
Anesthesia Post Note  Patient: Donna Ferrell  Procedure(s) Performed: Procedure(s) (LRB): Bilateral  Lumbar Five-Sacral One Lumbar Laminectomy, Complete Facetectomy, and Posterior Lateral Arthrodesis  (Right)  Patient location during evaluation: PACU Anesthesia Type: General Level of consciousness: sedated Pain management: pain level not controlled Vital Signs Assessment: post-procedure vital signs reviewed and stable Respiratory status: spontaneous breathing Cardiovascular status: stable Postop Assessment: no signs of nausea or vomiting Anesthetic complications: no     Last Vitals:  Vitals:   02/16/16 1244 02/16/16 1245  BP:    Pulse: 65 66  Resp: 17 17  Temp:      Last Pain:  Vitals:   02/16/16 1244  TempSrc:   PainSc: 8    Pain Goal:                 Sheralee Qazi JR,JOHN Sheilla Maris
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
## 2016-02-16 NOTE — Transfer of Care (Signed)
Immediate Anesthesia Transfer of Care Note  Patient: Donna Ferrell  Procedure(s) Performed: Procedure(s) with comments: Bilateral  Lumbar Five-Sacral One Lumbar Laminectomy, Complete Facetectomy, and Posterior Lateral Arthrodesis  (Right) - Bilateral  Lumbar Five-Sacral One Lumbar Laminectomy, Complete Facetectomy, and Posterior Lateral Arthrodesis  Patient Location: PACU  Anesthesia Type:General  Level of Consciousness: sedated  Airway & Oxygen Therapy: Patient Spontanous Breathing and Patient connected to face mask oxygen  Post-op Assessment: Report given to RN and Post -op Vital signs reviewed and stable  Post vital signs: Reviewed and stable  Last Vitals:  Vitals:   02/16/16 0640  BP: 103/77  Pulse: (!) 51  Resp: 20  Temp: 36.6 C    Last Pain:  Vitals:   02/16/16 0640  TempSrc: Oral         Complications: No apparent anesthesia complications

## 2016-02-16 NOTE — Anesthesia Procedure Notes (Signed)
Procedure Name: Intubation Date/Time: 02/16/2016 7:30 AM Performed by: Quentin Ore Pre-anesthesia Checklist: Patient identified, Emergency Drugs available, Suction available, Patient being monitored and Timeout performed Patient Re-evaluated:Patient Re-evaluated prior to inductionOxygen Delivery Method: Circle system utilized Preoxygenation: Pre-oxygenation with 100% oxygen Intubation Type: IV induction Ventilation: Mask ventilation without difficulty Laryngoscope Size: Mac and 3 Grade View: Grade I Tube type: Oral Tube size: 7.0 mm Number of attempts: 1 Airway Equipment and Method: Stylet Placement Confirmation: ETT inserted through vocal cords under direct vision,  positive ETCO2 and breath sounds checked- equal and bilateral Secured at: 22 cm Tube secured with: Tape Dental Injury: Teeth and Oropharynx as per pre-operative assessment  Comments: AOI per Aggie Hacker, SRNA with Dr. Arby Barrette supervising.

## 2016-02-16 NOTE — Addendum Note (Signed)
Addendum  created 02/16/16 1332 by Quentin Ore, CRNA   Anesthesia Intra Flowsheets edited, Anesthesia Intra Meds edited

## 2016-02-16 NOTE — Progress Notes (Signed)
Vitals:   02/16/16 1322 02/16/16 1336 02/16/16 1615 02/16/16 1621  BP:  97/61 (!) 143/51 110/75  Pulse: 67 69 88 (!) 52  Resp: 16 18 18 18   Temp: 97.3 F (36.3 C) 97.3 F (36.3 C) 97 F (36.1 C) 97.9 F (36.6 C)  TempSrc:      SpO2: 100% 96% 99% 100%  Weight:      Height:        Patient resting in bed, comfortable. Wound clean and dry. Patient has been up and ambulating twice in the halls. Foley DC'd, and patient has voided.  Plan: Doing well following surgery. Encouraged to ambulate. Have discussed with the patient and her wife discharge instructions.  Hewitt Shorts, MD 02/16/2016, 7:33 PM

## 2016-02-16 NOTE — Op Note (Signed)
02/16/2016  12:26 PM  PATIENT:  Donna Ferrell  55 y.o. female  PRE-OPERATIVE DIAGNOSIS:  Bilateral L5-S1 neural foraminal stenosis, right worse than left; lumbar spondylosis, lumbar degenerative disease, right L5 lumbar radiculopathy with dorsiflexor and EHL weakness  POST-OPERATIVE DIAGNOSIS:  Bilateral L5-S1 neural foraminal stenosis, right worse than left; lumbar spondylosis, lumbar degenerative disease, right L5 lumbar radiculopathy with dorsiflexor and EHL weakness  PROCEDURE:  Procedure(s):  Bilateral L5 and S1 laminectomy, bilateral L5-S1 facetectomy, and bilateral L5 and S1 foraminotomies for decompression of the stenotic compression of the L5 and S1 nerve roots;  bilateral L5-S1 posterior lumbar interbody arthrodesis with AVS peek interbody implants, Vitoss BA with bone marrow aspirate, and infuse; bilateral L5-S1 posterior lateral arthrodesis with Reline posterior instrumentation, Vitoss BA with bone marrow aspirate, and infuse; EMG monitoring throughout the procedure  SURGEON:  Surgeon(s): Shirlean Kelly, MD  ASSISTANTS: Altamease Oiler, M.D.  ANESTHESIA:   general  EBL:  Total I/O In: 1300 [I.V.:1300] Out: 320 [Urine:195; Blood:125]  BLOOD ADMINISTERED:none  CELL SAVER GIVEN: Cell Saver technician felt that there was insufficient blood loss or process to collect the blood  COUNT: Correct per nursing staff  DICTATION: Patient is brought to the operating room placed under general endotracheal anesthesia. The patient was turned to prone position the lumbar region was prepped with Betadine soap and solution and draped in a sterile fashion. The midline was infiltrated with local anesthesia with epinephrine. The C-arm fluoroscope was draped in a sterile fashion brought into the field and the L5-S1 level identified. A midline incision was made carried down through the subcutaneous tissue, bipolar cautery and electrocautery were used to maintain hemostasis. Dissection was carried down  to the lumbar fascia. The fascia was incised bilaterally and the paraspinal muscles were dissected with a spinous process and lamina in a subperiosteal fashion.  The C-arm fluoroscope was used to identify the L5-S1 intralaminar space.  Dissection was then carried out laterally to the facet complexes.  Using C-arm fluoroscopic guidance and intraoperative EMG we proceeded with placing screw shanks bilaterally at the L5 level. Entry points were identified for placing L5 cortical screws with a medial to lateral trajectory through the pedicle to the superior lateral aspect of the dorsal vertebral body. Each screw hole was drilled, examined with a ball probe, good bony surfaces were found, and then tapped with a 5.5 mm tap. 5.5 x 30 mm Reline Traction screw shanks were placed.  We then proceeded with the decompression. Bilateral L5 and S1 laminotomies were performed using the high-speed drill and Kerrison punches. Dissection was carried out laterally including a complete right L5-S1 facetectomy and a partial left L5-S1 facetectomy. Foraminotomies were performed for the exiting L5 and S1 nerve roots bilaterally. Particular attention was paid to the right L5-S1 neural foramen and the exiting right L5 nerve root. Spondylitic encroachment impingement was removed with decompression of the stenotic compression of the L5 and S1 nerve roots bilaterally.   To continue with decompression, bilateral L5-S1 discectomy was performed. There was significant spondylitic disc protrusion bilaterally. The annulus was incised, and the disc space entered. The disc space was markedly narrowed bilaterally, worse on the right than the left. Using a variety of curettes and pituitary rongeurs, we able to perform a discectomy. We then further decompressed the L5-S1 neural foramen bilaterally by continuing the lateral portion of the discectomy, so as to further decompress the ventral aspect of the exiting L5 nerve roots bilaterally.  Once the  decompression of the stenotic compression of  the exiting nerve roots was completed we proceeded with the posterior lumbar interbody arthrodesis. Once the discectomy was completed we began to prepare the endplate surfaces removing the cartilaginous endplates surface. We then measured the height of the intervertebral disc space. We selected 9 x 25 x 8 AVS peek interbody implants.  We then placed S1 screw shanks bilaterally at S1 with C-arm fluoroscopic guidance and intraoperative EMG. Entry points were identified, and screw holes were drilled with a medial to lateral trajectory.  They were examined with a ball probe, good bony surfaces were found, and then tapped with a 5.5 mm tap. 6.5 x 30 mm Reline Traction screw shanks were placed.  While placing the S1 screw shanks, we aspirated bone marrow aspirate from the S1 vertebral body, this was injected over a 10 cc and a 5 cc strip of Vitoss BA.   We then packed the AVS peek interbody implants with Vitoss BA with bone marrow aspirate and infuse, and then placed the first implant and on the right side, carefully retracting the thecal sac and nerve root medially. We then went back to the left side and packed the midline with additional Vitoss BA with bone marrow aspirate and infuse, and then placed a second implant and on the left side again retracting the thecal sac and nerve root medially. Additional Vitoss BA with bone marrow aspirate and infuse was packed lateral to the implants.  We then packed over the decorticated left inferior articular process of L5 and the left ala of S1 Vitoss BA with bone marrow aspirate and infuse. We then selected 5.0 x 30 mm pre-lordosed cobalt chrome rods; they were placed within the screw heads and secured with locking caps once all 4 locking caps were placed final tightening was performed against a counter torque.  The wound had been irrigated multiple times during the procedure with saline solution and bacitracin solution, good  hemostasis was established with a combination of bipolar cautery and Gelfoam with thrombin. Once good hemostasis was confirmed we proceeded with closure of the deep fascia with interrupted undyed 1 Vicryl sutures and the subcutaneous and subcuticular closed with interrupted inverted 2-0 undyed Vicryl sutures the skin edges were approximated with Dermabond.  Following surgery the patient was turned back to the supine position to be reversed and the anesthetic extubated and transferred to the recovery room for further care.  PLAN OF CARE: Admit for overnight observation  PATIENT DISPOSITION:  PACU - hemodynamically stable.   Delay start of Pharmacological VTE agent (>24hrs) due to surgical blood loss or risk of bleeding:  yes

## 2016-02-17 DIAGNOSIS — M5116 Intervertebral disc disorders with radiculopathy, lumbar region: Secondary | ICD-10-CM | POA: Diagnosis not present

## 2016-02-17 MED ORDER — CYCLOBENZAPRINE HCL 10 MG PO TABS: 5.0000 mg | ORAL_TABLET | Freq: Three times a day (TID) | ORAL | 0 refills | Status: DC | PRN

## 2016-02-17 MED ORDER — HYDROCODONE-ACETAMINOPHEN 5-325 MG PO TABS: 1.0000 | ORAL_TABLET | ORAL | 0 refills | Status: DC | PRN

## 2016-02-17 MED FILL — Sodium Chloride IV Soln 0.9%: INTRAVENOUS | Qty: 1000 | Status: AC

## 2016-02-17 MED FILL — Heparin Sodium (Porcine) Inj 1000 Unit/ML: INTRAMUSCULAR | Qty: 30 | Status: AC

## 2016-02-17 NOTE — Discharge Instructions (Signed)

## 2016-02-17 NOTE — Progress Notes (Signed)
Pt doing well. Pt and wife given D/C instructions with Rx's, verbal understanding was provided. Pt's incision is clean and dry with no sign of infection. Pt's IV was removed prior to D/C. Pt D/C'd home via wheelchair @ 0815 per MD order. Pt is stable @ D/C and has no other needs at this time. Rema Fendt, RN

## 2016-02-17 NOTE — Discharge Summary (Signed)
Physician Discharge Summary  Patient ID: Donna Ferrell MRN: [id number] DOB/AGE: [birth year] 55 y.o.  Admit date: 02/16/2016 Discharge date: 02/17/2016  Admission Diagnoses:  Bilateral L5-S1 neural foraminal stenosis, right worse than left; lumbar spondylosis, lumbar degenerative disease, right L5 lumbar radiculopathy with dorsiflexor and EHL weakness  Discharge Diagnoses:  Bilateral L5-S1 neural foraminal stenosis, right worse than left; lumbar spondylosis, lumbar degenerative disease, right L5 lumbar radiculopathy with dorsiflexor and EHL weakness Active Problems:   Lumbar stenosis with neurogenic claudication   Discharged Condition: good  Hospital Course: Patient was admitted underwent a bilateral L5-S1 lumbar decompression and arthrodesis. Was operatively she is done well. She is up and ambulate actively. She is voiding well. Her incision is clean and dry. She is asking to be discharged to home. She has been given instructions regarding wound care and activities following discharge. She is scheduled to return for follow-up with me in 3 weeks.  Discharge Exam: Blood pressure 119/89, pulse 72, temperature 98.3 F (36.8 C), temperature source Oral, resp. rate 18, height 5\' 5"  (1.651 m), weight 58 kg (127 lb 14 oz), last menstrual period 01/26/2016, SpO2 100 %.  Disposition:  Home    Medication List    TAKE these medications   ALPRAZolam 0.5 MG tablet Commonly known as:  XANAX TAKE 0.5MG  BY MOUTH EVERY 8 HOURS PRN FOR ANXIETY   buPROPion 300 MG 24 hr tablet Commonly known as:  WELLBUTRIN XL Take 300 mg by mouth daily.   cholecalciferol 1000 units tablet Commonly known as:  VITAMIN D Take 2,000 Units by mouth daily.   cyclobenzaprine 10 MG tablet Commonly known as:  FLEXERIL Take 0.5-1 tablets (5-10 mg total) by mouth 3 (three) times daily as needed for muscle spasms.   HYDROcodone-acetaminophen 5-325 MG tablet Commonly known as:  NORCO/VICODIN Take 1-2 tablets by mouth  every 4 (four) hours as needed (mild pain).   magnesium 30 MG tablet Take 1,000 mg by mouth once.   meloxicam 15 MG tablet Commonly known as:  MOBIC Take 15 mg by mouth daily.   multivitamin tablet Take 1 tablet by mouth daily.   pantoprazole 40 MG tablet Commonly known as:  PROTONIX Take 40 mg by mouth daily.        SignedHewitt Shorts 02/17/2016, 7:57 AM

## 2016-06-04 ENCOUNTER — Other Ambulatory Visit: Payer: Self-pay | Admitting: Otolaryngology

## 2016-06-04 DIAGNOSIS — H9312 Tinnitus, left ear: Secondary | ICD-10-CM

## 2016-06-18 ENCOUNTER — Ambulatory Visit
Admission: RE | Admit: 2016-06-18 | Discharge: 2016-06-18 | Disposition: A | Discharge status: Home / Self Care | Payer: 59 | Source: Ambulatory Visit | Attending: Otolaryngology | Admitting: Otolaryngology

## 2016-06-18 DIAGNOSIS — H9312 Tinnitus, left ear: Secondary | ICD-10-CM

## 2016-06-18 MED ORDER — GADOBENATE DIMEGLUMINE 529 MG/ML IV SOLN
15.0000 mL | Freq: Once | INTRAVENOUS | Status: AC | PRN
  Administered 2016-06-18: 12 mL via INTRAVENOUS

## 2016-08-03 DIAGNOSIS — Z981 Arthrodesis status: Secondary | ICD-10-CM | POA: Diagnosis not present

## 2016-08-03 DIAGNOSIS — M21371 Foot drop, right foot: Secondary | ICD-10-CM | POA: Diagnosis not present

## 2016-08-03 DIAGNOSIS — M5136 Other intervertebral disc degeneration, lumbar region: Secondary | ICD-10-CM | POA: Diagnosis not present

## 2016-08-03 DIAGNOSIS — M4726 Other spondylosis with radiculopathy, lumbar region: Secondary | ICD-10-CM | POA: Diagnosis not present

## 2016-09-08 ENCOUNTER — Ambulatory Visit: Payer: 59 | Admitting: Nurse Practitioner

## 2016-09-14 DIAGNOSIS — N390 Urinary tract infection, site not specified: Secondary | ICD-10-CM | POA: Diagnosis not present

## 2016-09-14 DIAGNOSIS — Z0000 Encounter for general adult medical examination without abnormal findings: Secondary | ICD-10-CM | POA: Diagnosis not present

## 2016-09-20 ENCOUNTER — Encounter: Payer: Self-pay | Admitting: Nurse Practitioner

## 2016-09-20 ENCOUNTER — Ambulatory Visit (INDEPENDENT_AMBULATORY_CARE_PROVIDER_SITE_OTHER): Payer: 59 | Admitting: Nurse Practitioner

## 2016-09-20 VITALS — BP 124/82 | HR 64 | Ht 65.75 in | Wt 132.0 lb

## 2016-09-20 DIAGNOSIS — Z0000 Encounter for general adult medical examination without abnormal findings: Secondary | ICD-10-CM | POA: Diagnosis not present

## 2016-09-20 DIAGNOSIS — R197 Diarrhea, unspecified: Secondary | ICD-10-CM | POA: Diagnosis not present

## 2016-09-20 DIAGNOSIS — E2839 Other primary ovarian failure: Secondary | ICD-10-CM | POA: Diagnosis not present

## 2016-09-20 DIAGNOSIS — Z01411 Encounter for gynecological examination (general) (routine) with abnormal findings: Secondary | ICD-10-CM

## 2016-09-20 LAB — CBC
HEMATOCRIT: 42.3 % (ref 35.0–45.0)
HEMOGLOBIN: 13.8 g/dL (ref 11.7–15.5)
MCH: 29.1 pg (ref 27.0–33.0)
MCHC: 32.6 g/dL (ref 32.0–36.0)
MCV: 89.1 fL (ref 80.0–100.0)
MPV: 10.5 fL (ref 7.5–12.5)
PLATELETS: 178 10*3/uL (ref 140–400)
RBC: 4.75 MIL/uL (ref 3.80–5.10)
RDW: 13.9 % (ref 11.0–15.0)
WBC: 3.8 10*3/uL (ref 3.8–10.8)

## 2016-09-20 LAB — COMPREHENSIVE METABOLIC PANEL
ALBUMIN: 4.1 g/dL (ref 3.6–5.1)
ALK PHOS: 45 U/L (ref 33–130)
ALT: 16 U/L (ref 6–29)
AST: 21 U/L (ref 10–35)
BUN: 8 mg/dL (ref 7–25)
CHLORIDE: 102 mmol/L (ref 98–110)
CO2: 28 mmol/L (ref 20–31)
Calcium: 9.3 mg/dL (ref 8.6–10.4)
Creat: 0.74 mg/dL (ref 0.50–1.05)
Glucose, Bld: 79 mg/dL (ref 65–99)
POTASSIUM: 3.5 mmol/L (ref 3.5–5.3)
Sodium: 138 mmol/L (ref 135–146)
TOTAL PROTEIN: 6.4 g/dL (ref 6.1–8.1)
Total Bilirubin: 0.4 mg/dL (ref 0.2–1.2)
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
## 2016-09-20 NOTE — Patient Instructions (Signed)

## 2016-09-21 ENCOUNTER — Encounter: Payer: Self-pay | Admitting: Nurse Practitioner

## 2016-09-21 ENCOUNTER — Other Ambulatory Visit: Payer: Self-pay | Admitting: Nurse Practitioner

## 2016-09-21 DIAGNOSIS — G319 Degenerative disease of nervous system, unspecified: Secondary | ICD-10-CM | POA: Insufficient documentation

## 2016-09-21 DIAGNOSIS — Z1231 Encounter for screening mammogram for malignant neoplasm of breast: Secondary | ICD-10-CM

## 2016-09-21 DIAGNOSIS — R030 Elevated blood-pressure reading, without diagnosis of hypertension: Secondary | ICD-10-CM | POA: Diagnosis not present

## 2016-09-21 DIAGNOSIS — Z0000 Encounter for general adult medical examination without abnormal findings: Secondary | ICD-10-CM | POA: Diagnosis not present

## 2016-09-21 DIAGNOSIS — M859 Disorder of bone density and structure, unspecified: Secondary | ICD-10-CM | POA: Diagnosis not present

## 2016-09-21 DIAGNOSIS — Z1389 Encounter for screening for other disorder: Secondary | ICD-10-CM | POA: Diagnosis not present

## 2016-09-21 LAB — FOLLICLE STIMULATING HORMONE: FSH: 74.5 m[IU]/mL

## 2016-09-22 NOTE — Progress Notes (Signed)
Encounter reviewed by Dr. Brook Amundson C. Silva.  

## 2016-09-30 DIAGNOSIS — Z1212 Encounter for screening for malignant neoplasm of rectum: Secondary | ICD-10-CM | POA: Diagnosis not present

## 2016-10-08 ENCOUNTER — Encounter: Payer: Self-pay | Admitting: Neurology

## 2016-10-08 ENCOUNTER — Ambulatory Visit: Payer: 59

## 2016-10-08 ENCOUNTER — Ambulatory Visit (INDEPENDENT_AMBULATORY_CARE_PROVIDER_SITE_OTHER): Payer: 59 | Admitting: Neurology

## 2016-10-08 VITALS — BP 137/96 | HR 64 | Ht 67.75 in | Wt 134.0 lb

## 2016-10-08 DIAGNOSIS — Z818 Family history of other mental and behavioral disorders: Secondary | ICD-10-CM | POA: Diagnosis not present

## 2016-10-08 DIAGNOSIS — E538 Deficiency of other specified B group vitamins: Secondary | ICD-10-CM

## 2016-10-08 DIAGNOSIS — G319 Degenerative disease of nervous system, unspecified: Secondary | ICD-10-CM | POA: Diagnosis not present

## 2016-10-08 NOTE — Patient Instructions (Addendum)
Remember to drink plenty of fluid, eat healthy meals and do not skip any meals. Try to eat protein with a every meal and eat a healthy snack such as fruit or nuts in between meals. Try to keep a regular sleep-wake schedule and try to exercise daily, particularly in the form of walking, 20-30 minutes a day, if you can.   As far as diagnostic testing: Formal neurocognitive testing, labs  Our phone number is [phone]. We also have an after hours call service for urgent matters and there is a physician on-call for urgent questions. For any emergencies you know to call 911 or go to the nearest emergency room
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
## 2016-10-10 ENCOUNTER — Encounter: Payer: Self-pay | Admitting: Neurology

## 2016-10-11 ENCOUNTER — Encounter: Payer: Self-pay | Admitting: Psychology

## 2016-10-11 ENCOUNTER — Telehealth: Payer: Self-pay | Admitting: *Deleted

## 2016-10-11 ENCOUNTER — Ambulatory Visit
Admission: RE | Admit: 2016-10-11 | Discharge: 2016-10-11 | Disposition: A | Discharge status: Home / Self Care | Payer: 59 | Source: Ambulatory Visit | Attending: Nurse Practitioner | Admitting: Nurse Practitioner

## 2016-10-11 DIAGNOSIS — Z1231 Encounter for screening mammogram for malignant neoplasm of breast: Secondary | ICD-10-CM

## 2016-10-11 NOTE — Telephone Encounter (Signed)
Called and LVM for pt about normal labs per AA,MD note. Gave [facility] phone number if she has further questions or concerns.  

## 2016-10-11 NOTE — Telephone Encounter (Signed)
-----   Message from Anson FretAntonia B Ahern, MD sent at 10/10/2016  8:55 AM EDT ----- Labs normal

## 2016-10-13 LAB — B12 AND FOLATE PANEL: VITAMIN B 12: 869 pg/mL (ref 232–1245)

## 2016-10-13 LAB — METHYLMALONIC ACID, SERUM: METHYLMALONIC ACID: 375 nmol/L (ref 0–378)

## 2016-11-11 ENCOUNTER — Encounter: Payer: 59 | Admitting: Psychology

## 2016-11-18 ENCOUNTER — Ambulatory Visit (INDEPENDENT_AMBULATORY_CARE_PROVIDER_SITE_OTHER): Payer: 59 | Admitting: Psychology

## 2016-11-18 ENCOUNTER — Encounter: Payer: Self-pay | Admitting: Psychology

## 2016-11-18 DIAGNOSIS — G319 Degenerative disease of nervous system, unspecified: Secondary | ICD-10-CM

## 2016-11-18 DIAGNOSIS — F339 Major depressive disorder, recurrent, unspecified: Secondary | ICD-10-CM | POA: Diagnosis not present

## 2016-11-18 DIAGNOSIS — Z818 Family history of other mental and behavioral disorders: Secondary | ICD-10-CM
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
## 2016-12-01 ENCOUNTER — Ambulatory Visit (INDEPENDENT_AMBULATORY_CARE_PROVIDER_SITE_OTHER): Payer: 59 | Admitting: Psychology

## 2016-12-01 DIAGNOSIS — G319 Degenerative disease of nervous system, unspecified: Secondary | ICD-10-CM

## 2016-12-01 DIAGNOSIS — Z818 Family history of other mental and behavioral disorders: Secondary | ICD-10-CM

## 2016-12-01 NOTE — Progress Notes (Signed)
   Neuropsychology Note  Donna Ferrell returned today for 2 hours of neuropsychological testing with technician, Wallace Kellerana Kham Zuckerman, BS, under the supervision of Dr. Elvis CoilMaryBeth Bailar. The patient did not appear overtly distressed by the testing session, per behavioral observation or via self-report to the technician. Rest breaks were offered. Donna Ferrell will return within 2 weeks for a feedback session with Dr. Alinda DoomsBailar at which time her test performances, clinical impressions and treatment recommendations will be reviewed in detail. The patient understands she can contact our office should she require our assistance before this time.  Full report to follow.

## 2016-12-08 DIAGNOSIS — H5213 Myopia, bilateral: Secondary | ICD-10-CM | POA: Diagnosis not present
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
## 2016-12-09 ENCOUNTER — Encounter: Payer: Self-pay | Admitting: Psychology

## 2016-12-09 ENCOUNTER — Ambulatory Visit (INDEPENDENT_AMBULATORY_CARE_PROVIDER_SITE_OTHER): Payer: 59 | Admitting: Psychology

## 2016-12-09 DIAGNOSIS — G319 Degenerative disease of nervous system, unspecified: Secondary | ICD-10-CM | POA: Diagnosis not present

## 2016-12-09 DIAGNOSIS — Z818 Family history of other mental and behavioral disorders: Secondary | ICD-10-CM

## 2016-12-09 NOTE — Patient Instructions (Signed)
  Results of the current evaluation were largely well above average for age, and commensurate with estimated premorbid intellectual abilities. Processing speed, auditory attention and working memory, Bankervisual-spatial construction, verbal learning and memory, and mental flexibility and set-shifting were all well above average with most performances falling in the superior to very superior range. Confrontation naming and verbal fluency were average, as was abstract reasoning. She did demonstrate one area of focal difficulty on one test assessing memory retrieval of visual information. However, with multiple choice she accurately could differentiate information she had previously viewed versus that she had not. At this point in time, this finding is not concerning to me as it may represent normal variability across tests, and because the patient is not experiencing any functional impairment in daily life that corroborates this finding. Of course, testing can be completed again in the future (especially if she notices any decline in functional abilities) for test-retest comparison.  The patient does appear to present with mild depression which she is aware of and which she is being treated for. She feels her mood is stable and even improved more recently.    Recommendations/Plan: Based on the findings of the present evaluation, the following recommendations are offered:  1. These results will serve as a nice baseline for future comparison if ever needed. 2. The patient is encouraged to continue to participate in activities that enhance brain health, including regular cardiovascular exercise, mental stimulation, healthy diet and social interaction.  3. She should continue treatment for depression, and if depression worsens in the future she may want to consider participating in psychotherapy again.

## 2017-01-26 ENCOUNTER — Ambulatory Visit (INDEPENDENT_AMBULATORY_CARE_PROVIDER_SITE_OTHER): Payer: 59 | Admitting: Sports Medicine

## 2017-01-26 VITALS — BP 124/70 | Ht 66.0 in | Wt 134.0 lb

## 2017-01-26 DIAGNOSIS — M25561 Pain in right knee: Secondary | ICD-10-CM | POA: Diagnosis not present

## 2017-01-26 DIAGNOSIS — R252 Cramp and spasm: Secondary | ICD-10-CM

## 2017-01-26 DIAGNOSIS — R42 Dizziness and giddiness: Secondary | ICD-10-CM | POA: Diagnosis not present

## 2017-01-27 NOTE — Progress Notes (Signed)
   Subjective:    Patient ID: Donna Ferrell, female    DOB: [birth year], 56 y.o.   MRN: [id number]  HPI chief complaint: Bilateral foot spasm  Patient comes in today complaining of 1 year of bilateral foot spasm. Symptoms are worse at night. They will awaken her from her sleep. They are sometimes present during the morning as well. Not really present during the rest of the day. It is in both feet. She has tried Gatorade, water, and magnesium supplements but nothing has worked. Her symptoms are starting to affect her sleep. They improve if she gets out of bed and walks around. She denies spasm or cramping elsewhere in her body. Recent blood work shows a normal calcium level, normal CBC, and normal CMP.    Review of Systems    as above Objective:   Physical Exam  Fit appearing. No acute distress  Examination of both feet are fairly unremarkable. No spasm. No clonus. No atrophy. Sensation is intact to light touch. Good strength.      Assessment & Plan:   Chronic bilateral foot spasm  I'll start with getting some additional blood work including a magnesium level, vitamin E level, vitamin D level, and a ferritin. I wonder if this is a variant of restless leg syndrome so I will research that a little more. I will call the patient with results of her blood work when available.

## 2017-02-08 ENCOUNTER — Telehealth: Payer: Self-pay | Admitting: Nurse Practitioner

## 2017-02-08 NOTE — Telephone Encounter (Signed)
Left patient a message to call back to reschedule a future appointment that was cancelled by the provider for [facility]. °

## 2017-02-11 ENCOUNTER — Telehealth: Payer: Self-pay | Admitting: Sports Medicine

## 2017-02-11 NOTE — Telephone Encounter (Signed)
I spoke with Britta MccreedyBarbara on the phone today about her blood work. Her ferritin is at the low end of normal (18). She will try to incorporate more iron rich vegetables into her diet and we will plan on rechecking a ferritin level in 3 months.

## 2017-02-14 ENCOUNTER — Encounter: Payer: Self-pay | Admitting: Sports Medicine

## 2017-03-08 DIAGNOSIS — L820 Inflamed seborrheic keratosis: Secondary | ICD-10-CM | POA: Diagnosis not present

## 2017-03-08 DIAGNOSIS — L720 Epidermal cyst: Secondary | ICD-10-CM | POA: Diagnosis not present

## 2017-04-13 DIAGNOSIS — S72001A Fracture of unspecified part of neck of right femur, initial encounter for closed fracture: Secondary | ICD-10-CM | POA: Diagnosis not present

## 2017-04-13 DIAGNOSIS — S32810A Multiple fractures of pelvis with stable disruption of pelvic ring, initial encounter for closed fracture: Secondary | ICD-10-CM | POA: Diagnosis not present

## 2017-04-13 DIAGNOSIS — S32591A Other specified fracture of right pubis, initial encounter for closed fracture: Secondary | ICD-10-CM | POA: Diagnosis not present

## 2017-04-13 DIAGNOSIS — S3210XA Unspecified fracture of sacrum, initial encounter for closed fracture: Secondary | ICD-10-CM | POA: Diagnosis not present

## 2017-04-13 DIAGNOSIS — S32511A Fracture of superior rim of right pubis, initial encounter for closed fracture: Secondary | ICD-10-CM | POA: Diagnosis not present

## 2017-04-14 DIAGNOSIS — S32810A Multiple fractures of pelvis with stable disruption of pelvic ring, initial encounter for closed fracture: Secondary | ICD-10-CM | POA: Diagnosis not present

## 2017-04-21 ENCOUNTER — Ambulatory Visit (INDEPENDENT_AMBULATORY_CARE_PROVIDER_SITE_OTHER): Payer: 59 | Admitting: Sports Medicine

## 2017-04-21 DIAGNOSIS — S32591A Other specified fracture of right pubis, initial encounter for closed fracture: Secondary | ICD-10-CM | POA: Diagnosis not present
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
## 2017-04-26 ENCOUNTER — Encounter: Payer: Self-pay | Admitting: Sports Medicine

## 2017-05-13 DIAGNOSIS — S329XXA Fracture of unspecified parts of lumbosacral spine and pelvis, initial encounter for closed fracture: Secondary | ICD-10-CM | POA: Diagnosis not present

## 2017-05-13 DIAGNOSIS — M4726 Other spondylosis with radiculopathy, lumbar region: Secondary | ICD-10-CM | POA: Diagnosis not present

## 2017-05-13 DIAGNOSIS — M8580 Other specified disorders of bone density and structure, unspecified site: Secondary | ICD-10-CM | POA: Diagnosis not present

## 2017-05-13 DIAGNOSIS — M5136 Other intervertebral disc degeneration, lumbar region: Secondary | ICD-10-CM | POA: Diagnosis not present

## 2017-05-13 DIAGNOSIS — M4156 Other secondary scoliosis, lumbar region: Secondary | ICD-10-CM | POA: Diagnosis not present

## 2017-05-13 DIAGNOSIS — Z981 Arthrodesis status: Secondary | ICD-10-CM | POA: Diagnosis not present

## 2017-05-20 ENCOUNTER — Encounter: Payer: Self-pay | Admitting: Sports Medicine

## 2017-05-20 ENCOUNTER — Other Ambulatory Visit: Payer: Self-pay

## 2017-05-20 DIAGNOSIS — S32591A Other specified fracture of right pubis, initial encounter for closed fracture: Secondary | ICD-10-CM

## 2017-05-20 DIAGNOSIS — M62838 Other muscle spasm: Secondary | ICD-10-CM

## 2017-06-02 ENCOUNTER — Ambulatory Visit: Payer: 59 | Admitting: Sports Medicine

## 2017-06-03 ENCOUNTER — Ambulatory Visit: Payer: 59 | Admitting: Sports Medicine

## 2017-06-03 ENCOUNTER — Encounter: Payer: Self-pay | Admitting: Sports Medicine

## 2017-06-03 ENCOUNTER — Ambulatory Visit
Admission: RE | Admit: 2017-06-03 | Discharge: 2017-06-03 | Disposition: A | Discharge status: Home / Self Care | Payer: 59 | Source: Ambulatory Visit | Attending: Sports Medicine | Admitting: Sports Medicine

## 2017-06-03 VITALS — BP 148/92 | Ht 66.0 in | Wt 135.0 lb

## 2017-06-03 DIAGNOSIS — S32591D Other specified fracture of right pubis, subsequent encounter for fracture with routine healing: Secondary | ICD-10-CM | POA: Diagnosis not present

## 2017-06-03 DIAGNOSIS — S32501A Unspecified fracture of right pubis, initial encounter for closed fracture: Secondary | ICD-10-CM | POA: Diagnosis not present

## 2017-06-03 DIAGNOSIS — S32592D Other specified fracture of left pubis, subsequent encounter for fracture with routine healing: Secondary | ICD-10-CM

## 2017-06-03 DIAGNOSIS — S32592A Other specified fracture of left pubis, initial encounter for closed fracture: Principal | ICD-10-CM

## 2017-06-03 DIAGNOSIS — S32591A Other specified fracture of right pubis, initial encounter for closed fracture: Secondary | ICD-10-CM
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
## 2017-06-06 ENCOUNTER — Other Ambulatory Visit: Payer: Self-pay

## 2017-06-06 DIAGNOSIS — S32591A Other specified fracture of right pubis, initial encounter for closed fracture: Secondary | ICD-10-CM

## 2017-06-13 DIAGNOSIS — H2513 Age-related nuclear cataract, bilateral: Secondary | ICD-10-CM | POA: Diagnosis not present

## 2017-06-13 DIAGNOSIS — H2511 Age-related nuclear cataract, right eye: Secondary | ICD-10-CM | POA: Diagnosis not present

## 2017-06-15 ENCOUNTER — Ambulatory Visit: Payer: 59 | Attending: Sports Medicine | Admitting: Physical Therapy

## 2017-06-15 ENCOUNTER — Encounter: Payer: Self-pay | Admitting: Physical Therapy

## 2017-06-15 DIAGNOSIS — R2689 Other abnormalities of gait and mobility: Secondary | ICD-10-CM | POA: Diagnosis not present

## 2017-06-15 DIAGNOSIS — M6281 Muscle weakness (generalized): Secondary | ICD-10-CM

## 2017-06-15 DIAGNOSIS — X58XXXD Exposure to other specified factors, subsequent encounter: Secondary | ICD-10-CM | POA: Insufficient documentation

## 2017-06-15 DIAGNOSIS — S32591D Other specified fracture of right pubis, subsequent encounter for fracture with routine healing: Secondary | ICD-10-CM | POA: Diagnosis present

## 2017-06-15 DIAGNOSIS — M25651 Stiffness of right hip, not elsewhere classified: Secondary | ICD-10-CM | POA: Diagnosis not present
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
## 2017-06-20 ENCOUNTER — Encounter (INDEPENDENT_AMBULATORY_CARE_PROVIDER_SITE_OTHER): Payer: 59 | Admitting: Ophthalmology

## 2017-06-22 ENCOUNTER — Encounter (INDEPENDENT_AMBULATORY_CARE_PROVIDER_SITE_OTHER): Payer: 59 | Admitting: Ophthalmology

## 2017-06-22 ENCOUNTER — Encounter: Payer: Self-pay | Admitting: Physical Therapy

## 2017-06-22 ENCOUNTER — Ambulatory Visit: Payer: 59 | Attending: Sports Medicine | Admitting: Physical Therapy

## 2017-06-22 DIAGNOSIS — S32592D Other specified fracture of left pubis, subsequent encounter for fracture with routine healing: Secondary | ICD-10-CM | POA: Insufficient documentation

## 2017-06-22 DIAGNOSIS — S32591D Other specified fracture of right pubis, subsequent encounter for fracture with routine healing: Secondary | ICD-10-CM | POA: Diagnosis not present

## 2017-06-22 DIAGNOSIS — R2689 Other abnormalities of gait and mobility: Secondary | ICD-10-CM

## 2017-06-22 DIAGNOSIS — X58XXXD Exposure to other specified factors, subsequent encounter: Secondary | ICD-10-CM | POA: Diagnosis not present

## 2017-06-22 DIAGNOSIS — M25651 Stiffness of right hip, not elsewhere classified: Secondary | ICD-10-CM

## 2017-06-22 DIAGNOSIS — M6281 Muscle weakness (generalized): Secondary | ICD-10-CM
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
## 2017-06-27 ENCOUNTER — Ambulatory Visit: Payer: 59 | Admitting: Physical Therapy

## 2017-06-29 ENCOUNTER — Ambulatory Visit: Payer: 59 | Admitting: Physical Therapy

## 2017-06-29 DIAGNOSIS — M6281 Muscle weakness (generalized): Secondary | ICD-10-CM

## 2017-06-29 DIAGNOSIS — R2689 Other abnormalities of gait and mobility: Secondary | ICD-10-CM

## 2017-06-29 DIAGNOSIS — S32591D Other specified fracture of right pubis, subsequent encounter for fracture with routine healing: Secondary | ICD-10-CM | POA: Diagnosis not present

## 2017-06-29 DIAGNOSIS — M25651 Stiffness of right hip, not elsewhere classified: Secondary | ICD-10-CM
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
## 2017-07-04 ENCOUNTER — Encounter: Payer: Self-pay | Admitting: Physical Therapy

## 2017-07-04 ENCOUNTER — Encounter (INDEPENDENT_AMBULATORY_CARE_PROVIDER_SITE_OTHER): Payer: 59 | Admitting: Ophthalmology

## 2017-07-04 ENCOUNTER — Ambulatory Visit: Payer: 59 | Admitting: Physical Therapy

## 2017-07-04 DIAGNOSIS — S32591D Other specified fracture of right pubis, subsequent encounter for fracture with routine healing: Secondary | ICD-10-CM

## 2017-07-04 DIAGNOSIS — H33303 Unspecified retinal break, bilateral: Secondary | ICD-10-CM

## 2017-07-04 DIAGNOSIS — H43813 Vitreous degeneration, bilateral: Secondary | ICD-10-CM

## 2017-07-04 DIAGNOSIS — M25651 Stiffness of right hip, not elsewhere classified: Secondary | ICD-10-CM

## 2017-07-04 DIAGNOSIS — R2689 Other abnormalities of gait and mobility: Secondary | ICD-10-CM

## 2017-07-04 DIAGNOSIS — H2512 Age-related nuclear cataract, left eye: Secondary | ICD-10-CM

## 2017-07-04 DIAGNOSIS — M6281 Muscle weakness (generalized): Secondary | ICD-10-CM
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
## 2017-07-04 NOTE — Patient Instructions (Signed)
Gave info on standing ex done today:  Step ups SLS with glute med drops Squats

## 2017-07-06 ENCOUNTER — Ambulatory Visit: Payer: 59 | Admitting: Physical Therapy

## 2017-07-06 DIAGNOSIS — S32591D Other specified fracture of right pubis, subsequent encounter for fracture with routine healing: Secondary | ICD-10-CM

## 2017-07-06 DIAGNOSIS — M6281 Muscle weakness (generalized): Secondary | ICD-10-CM

## 2017-07-06 DIAGNOSIS — M25651 Stiffness of right hip, not elsewhere classified: Secondary | ICD-10-CM

## 2017-07-06 DIAGNOSIS — R2689 Other abnormalities of gait and mobility: Secondary | ICD-10-CM
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
## 2017-07-07 ENCOUNTER — Ambulatory Visit
Admission: RE | Admit: 2017-07-07 | Discharge: 2017-07-07 | Disposition: A | Discharge status: Home / Self Care | Payer: 59 | Source: Ambulatory Visit | Attending: Sports Medicine | Admitting: Sports Medicine

## 2017-07-07 ENCOUNTER — Ambulatory Visit: Payer: 59 | Admitting: Sports Medicine

## 2017-07-07 ENCOUNTER — Encounter: Payer: Self-pay | Admitting: Sports Medicine

## 2017-07-07 VITALS — BP 146/91 | Ht 66.0 in | Wt 135.0 lb

## 2017-07-07 DIAGNOSIS — S32592D Other specified fracture of left pubis, subsequent encounter for fracture with routine healing: Secondary | ICD-10-CM | POA: Diagnosis not present

## 2017-07-07 DIAGNOSIS — S32591D Other specified fracture of right pubis, subsequent encounter for fracture with routine healing: Secondary | ICD-10-CM

## 2017-07-07 DIAGNOSIS — S32501A Unspecified fracture of right pubis, initial encounter for closed fracture: Secondary | ICD-10-CM | POA: Diagnosis not present

## 2017-07-07 DIAGNOSIS — S32591A Other specified fracture of right pubis, initial encounter for closed fracture: Secondary | ICD-10-CM

## 2017-07-08 ENCOUNTER — Encounter (INDEPENDENT_AMBULATORY_CARE_PROVIDER_SITE_OTHER): Payer: 59 | Admitting: Ophthalmology

## 2017-07-08 DIAGNOSIS — H33302 Unspecified retinal break, left eye: Secondary | ICD-10-CM

## 2017-07-08 NOTE — Progress Notes (Signed)
   Subjective:    Patient ID: Donna Ferrell, female    DOB: 12/30/1960, 56 y.o.   MRN: [id number]  HPI   Donna Ferrell comes in today for follow-up on a pubic rami fracture that she suffered 3 months ago. She is doing well. Minimal pain over the fracture site. She is working diligently in physical therapy and has added in some Pilates. She has yet to return to cycling. Main complaint today is some discomfort along the right lateral calf. Physical therapist has been working on releasing her peroneal nerve. This has been helpful. She has had peroneal nerve irritation in the past. In fact, her initial discomfort in this area with secondary to lumbar degenerative disc disease. She has since undergone fusion for that. Overall, doing better. She does endorse weakness and fatigue in her right foot when walking long distances. She does have a slight slapping gait when walking from time to time. She states that this is her baseline and it has been this way since surgery. She's been on gabapentin in the past but did not tolerate it very well.    Review of Systems As above    Objective:   Physical Exam  Well-developed, well-nourished. No acute distress. Awake alert and oriented 3. Vital signs reviewed  Pelvis: Slight tenderness to palpation over the pubic rami but not markedly. No tenderness with pelvic rocking. Good strength in the right hip. Slight Tinel's over the right peroneal nerve at the fibular head. Slight weakness with right foot dorsiflexion. Walking with a normal gait.  X-rays of her pelvis show continued healing of her fracture      Assessment & Plan:   3 months status post pubic rami fracture-healing Mild intermittent foot drop (chronic)  Patient is doing very well. I think it is important for her to continue with physical therapy and I will leave discharge to the discretion of the physical therapist. I think it is all right for the patient to resume some cycling and increase this  activity using pain as her guide. In regards to her peroneal nerve irritation, as long as pain or weakness do not worsen, then I do not think we need to work this up further. However, if symptoms do worsen then I would consider an EMG/nerve conduction study just to differentiate between chronic lumbar spine irritation versus peroneal nerve entrapment. Follow-up for ongoing or recalcitrant issues.

## 2017-07-11 ENCOUNTER — Encounter: Payer: 59 | Admitting: Physical Therapy

## 2017-07-13 ENCOUNTER — Encounter: Payer: 59 | Admitting: Physical Therapy

## 2017-07-18 ENCOUNTER — Ambulatory Visit: Payer: 59 | Admitting: Physical Therapy

## 2017-07-18 ENCOUNTER — Encounter: Payer: Self-pay | Admitting: Physical Therapy

## 2017-07-18 DIAGNOSIS — M6281 Muscle weakness (generalized): Secondary | ICD-10-CM

## 2017-07-18 DIAGNOSIS — S32591D Other specified fracture of right pubis, subsequent encounter for fracture with routine healing: Secondary | ICD-10-CM | POA: Diagnosis not present

## 2017-07-18 DIAGNOSIS — M25651 Stiffness of right hip, not elsewhere classified: Secondary | ICD-10-CM

## 2017-07-18 DIAGNOSIS — R2689 Other abnormalities of gait and mobility: Secondary | ICD-10-CM
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
## 2017-07-20 ENCOUNTER — Ambulatory Visit: Payer: 59 | Attending: Sports Medicine | Admitting: Physical Therapy

## 2017-07-20 ENCOUNTER — Encounter: Payer: Self-pay | Admitting: Physical Therapy

## 2017-07-20 DIAGNOSIS — M6281 Muscle weakness (generalized): Secondary | ICD-10-CM | POA: Diagnosis not present

## 2017-07-20 DIAGNOSIS — R2689 Other abnormalities of gait and mobility: Secondary | ICD-10-CM | POA: Diagnosis not present

## 2017-07-20 DIAGNOSIS — X58XXXD Exposure to other specified factors, subsequent encounter: Secondary | ICD-10-CM | POA: Diagnosis not present

## 2017-07-20 DIAGNOSIS — M25651 Stiffness of right hip, not elsewhere classified: Secondary | ICD-10-CM | POA: Diagnosis not present

## 2017-07-20 DIAGNOSIS — S32591D Other specified fracture of right pubis, subsequent encounter for fracture with routine healing: Secondary | ICD-10-CM | POA: Diagnosis not present
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
## 2017-07-25 ENCOUNTER — Ambulatory Visit: Payer: 59 | Admitting: Physical Therapy

## 2017-07-25 ENCOUNTER — Encounter: Payer: Self-pay | Admitting: Physical Therapy

## 2017-07-25 DIAGNOSIS — R2689 Other abnormalities of gait and mobility: Secondary | ICD-10-CM

## 2017-07-25 DIAGNOSIS — M25651 Stiffness of right hip, not elsewhere classified: Secondary | ICD-10-CM

## 2017-07-25 DIAGNOSIS — S32591D Other specified fracture of right pubis, subsequent encounter for fracture with routine healing: Secondary | ICD-10-CM

## 2017-07-25 DIAGNOSIS — M6281 Muscle weakness (generalized): Secondary | ICD-10-CM
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
## 2017-07-28 ENCOUNTER — Encounter: Payer: Self-pay | Admitting: Physical Therapy

## 2017-07-28 ENCOUNTER — Ambulatory Visit: Payer: 59 | Admitting: Physical Therapy

## 2017-07-28 ENCOUNTER — Encounter: Payer: 59 | Admitting: Physical Therapy

## 2017-07-28 DIAGNOSIS — S32591D Other specified fracture of right pubis, subsequent encounter for fracture with routine healing: Secondary | ICD-10-CM

## 2017-07-28 DIAGNOSIS — M25651 Stiffness of right hip, not elsewhere classified: Secondary | ICD-10-CM

## 2017-07-28 DIAGNOSIS — R2689 Other abnormalities of gait and mobility: Secondary | ICD-10-CM

## 2017-07-28 DIAGNOSIS — M6281 Muscle weakness (generalized): Secondary | ICD-10-CM
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
## 2017-08-01 ENCOUNTER — Encounter: Payer: Self-pay | Admitting: Physical Therapy

## 2017-08-01 ENCOUNTER — Ambulatory Visit: Payer: 59 | Admitting: Physical Therapy

## 2017-08-01 DIAGNOSIS — S32591D Other specified fracture of right pubis, subsequent encounter for fracture with routine healing: Secondary | ICD-10-CM

## 2017-08-01 DIAGNOSIS — R2689 Other abnormalities of gait and mobility: Secondary | ICD-10-CM

## 2017-08-01 DIAGNOSIS — M6281 Muscle weakness (generalized): Secondary | ICD-10-CM

## 2017-08-01 DIAGNOSIS — M25651 Stiffness of right hip, not elsewhere classified: Secondary | ICD-10-CM

## 2017-08-01 NOTE — Patient Instructions (Signed)
Single leg hip hinge for core, hips given from HEP 2 go. 3 x per week x 10 each side  Massage therapists Pilates class info
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
## 2017-08-03 ENCOUNTER — Ambulatory Visit: Payer: 59 | Admitting: Physical Therapy

## 2017-08-03 ENCOUNTER — Encounter: Payer: Self-pay | Admitting: Physical Therapy

## 2017-08-03 ENCOUNTER — Encounter (INDEPENDENT_AMBULATORY_CARE_PROVIDER_SITE_OTHER): Payer: 59 | Admitting: Ophthalmology

## 2017-08-03 DIAGNOSIS — M6281 Muscle weakness (generalized): Secondary | ICD-10-CM

## 2017-08-03 DIAGNOSIS — R2689 Other abnormalities of gait and mobility: Secondary | ICD-10-CM

## 2017-08-03 DIAGNOSIS — S32591D Other specified fracture of right pubis, subsequent encounter for fracture with routine healing: Secondary | ICD-10-CM

## 2017-08-03 DIAGNOSIS — M25651 Stiffness of right hip, not elsewhere classified: Secondary | ICD-10-CM

## 2017-08-03 DIAGNOSIS — H33303 Unspecified retinal break, bilateral: Secondary | ICD-10-CM
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
## 2017-08-07 ENCOUNTER — Encounter: Payer: Self-pay | Admitting: Sports Medicine

## 2017-08-08 ENCOUNTER — Other Ambulatory Visit: Payer: Self-pay

## 2017-08-08 DIAGNOSIS — M62838 Other muscle spasm: Secondary | ICD-10-CM

## 2017-08-15 ENCOUNTER — Other Ambulatory Visit: Payer: 59

## 2017-08-15 DIAGNOSIS — M62838 Other muscle spasm: Secondary | ICD-10-CM

## 2017-08-16 LAB — FERRITIN: FERRITIN: 45 ng/mL (ref 15–150)

## 2017-08-22 ENCOUNTER — Encounter: Payer: Self-pay | Admitting: Sports Medicine

## 2017-09-23 ENCOUNTER — Ambulatory Visit: Payer: 59 | Admitting: Nurse Practitioner

## 2017-09-27 ENCOUNTER — Other Ambulatory Visit: Payer: Self-pay | Admitting: Obstetrics and Gynecology

## 2017-09-27 DIAGNOSIS — Z1231 Encounter for screening mammogram for malignant neoplasm of breast: Secondary | ICD-10-CM

## 2017-10-03 ENCOUNTER — Encounter: Payer: Self-pay | Admitting: Obstetrics and Gynecology

## 2017-10-03 ENCOUNTER — Other Ambulatory Visit (HOSPITAL_COMMUNITY)
Admission: RE | Admit: 2017-10-03 | Discharge: 2017-10-03 | Disposition: A | Discharge status: Home / Self Care | Payer: 59 | Source: Ambulatory Visit | Attending: Obstetrics and Gynecology | Admitting: Obstetrics and Gynecology

## 2017-10-03 ENCOUNTER — Ambulatory Visit (INDEPENDENT_AMBULATORY_CARE_PROVIDER_SITE_OTHER): Payer: 59 | Admitting: Obstetrics and Gynecology

## 2017-10-03 ENCOUNTER — Other Ambulatory Visit: Payer: Self-pay

## 2017-10-03 VITALS — BP 124/70 | HR 80 | Resp 16 | Ht 65.5 in | Wt 137.0 lb

## 2017-10-03 DIAGNOSIS — Z01419 Encounter for gynecological examination (general) (routine) without abnormal findings: Secondary | ICD-10-CM | POA: Diagnosis not present
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
## 2017-10-03 NOTE — Patient Instructions (Signed)
Please ask your primary care provider about Paxil or Effexor to treat depression and help hot flashes.  EXERCISE AND DIET:  We recommended that you start or continue a regular exercise program for good health. Regular exercise means any activity that makes your heart beat faster and makes you sweat.  We recommend exercising at least 30 minutes per day at least 3 days a week, preferably 4 or 5.  We also recommend a diet low in fat and sugar.  Inactivity, poor dietary choices and obesity can cause diabetes, heart attack, stroke, and kidney damage, among others.    ALCOHOL AND SMOKING:  Women should limit their alcohol intake to no more than 7 drinks/beers/glasses of wine (combined, not each!) per week. Moderation of alcohol intake to this level decreases your risk of breast cancer and liver damage. And of course, no recreational drugs are part of a healthy lifestyle.  And absolutely no smoking or even second hand smoke. Most people know smoking can cause heart and lung diseases, but did you know it also contributes to weakening of your bones? Aging of your skin?  Yellowing of your teeth and nails?  CALCIUM AND VITAMIN D:  Adequate intake of calcium and Vitamin D are recommended.  The recommendations for exact amounts of these supplements seem to change often, but generally speaking 600 mg of calcium (either carbonate or citrate) and 800 units of Vitamin D per day seems prudent. Certain women may benefit from higher intake of Vitamin D.  If you are among these women, your doctor will have told you during your visit.    PAP SMEARS:  Pap smears, to check for cervical cancer or precancers,  have traditionally been done yearly, although recent scientific advances have shown that most women can have pap smears less often.  However, every woman still should have a physical exam from her gynecologist every year. It will include a breast check, inspection of the vulva and vagina to check for abnormal growths or skin  changes, a visual exam of the cervix, and then an exam to evaluate the size and shape of the uterus and ovaries.  And after 57 years of age, a rectal exam is indicated to check for rectal cancers. We will also provide age appropriate advice regarding health maintenance, like when you should have certain vaccines, screening for sexually transmitted diseases, bone density testing, colonoscopy, mammograms, etc.   MAMMOGRAMS:  All women over 57 years old should have a yearly mammogram. Many facilities now offer a "3D" mammogram, which may cost around $50 extra out of pocket. If possible,  we recommend you accept the option to have the 3D mammogram performed.  It both reduces the number of women who will be called back for extra views which then turn out to be normal, and it is better than the routine mammogram at detecting truly abnormal areas.    COLONOSCOPY:  Colonoscopy to screen for colon cancer is recommended for all women at age 57.  We know, you hate the idea of the prep.  We agree, BUT, having colon cancer and not knowing it is worse!!  Colon cancer so often starts as a polyp that can be seen and removed at colonscopy, which can quite literally save your life!  And if your first colonoscopy is normal and you have no family history of colon cancer, most women don't have to have it again for 10 years.  Once every ten years, you can do something that may end up saving  your life, right?  We will be happy to help you get it scheduled when you are ready.  Be sure to check your insurance coverage so you understand how much it will cost.  It may be covered as a preventative service at no cost, but you should check your particular policy.

## 2017-10-05 LAB — CYTOLOGY - PAP
DIAGNOSIS: NEGATIVE
HPV: NOT DETECTED

## 2017-10-06 ENCOUNTER — Encounter: Payer: Self-pay | Admitting: Sports Medicine

## 2017-10-10 ENCOUNTER — Encounter: Payer: Self-pay | Admitting: Neurology

## 2017-10-10 ENCOUNTER — Ambulatory Visit (INDEPENDENT_AMBULATORY_CARE_PROVIDER_SITE_OTHER): Payer: 59 | Admitting: Neurology

## 2017-10-10 VITALS — BP 122/79 | HR 58 | Ht 66.0 in | Wt 136.0 lb

## 2017-10-10 DIAGNOSIS — G319 Degenerative disease of nervous system, unspecified: Secondary | ICD-10-CM
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
## 2017-10-10 NOTE — Patient Instructions (Signed)
Return as needed  Pick Disease Pick disease is a rare, progressive brain disorder. In Pick disease, a loss of brain cells and a buildup of abnormal proteins inside brain cells cause changes in behavior and speech. Over time, the disease causes the frontal and temporal anterior lobes of the brain to shrink. These are the parts of the brain that control behaviors and speech. Pick disease is one of the most common causes of progressive mental disability in people younger than 60 years. The disease progresses faster in some people than in others. What are the causes? The exact cause of Pick disease is not known. Some genetic changes in chromosomes have been linked to Pick disease. The disease tends to run in families. Family members of people with Pick disease should consider genetic counseling. What are the signs or symptoms? Signs and symptoms of Pick disease usually start between the ages of 2455 years and 65 years. Behavior symptoms may include:  Impulsive, poorly controlled, inappropriate, or embarrassing behavior.  Lack of motivation and interest (apathy).  Irritability and agitation.  Neglect of personal hygiene.  Withdrawal.  Repetitive behaviors.  Impulsive eating.  Lack of concern for others.  Failure to recognize behavioral changes.  Inability to speak.  Inability to write or read.  Slurred speech.  Loss of vocabulary.  How is this diagnosed? Your health care provider may suspect Pick disease if you have progressive behavior or speech difficulties. You may need to see specialists in brain and behavioral health. The following tests may be done:  Blood tests to rule out other causes, like vitamin deficiency, harmful effects of substances (toxicities), and infections.  Spinal tap (lumbar puncture) to check spinal fluid samples for abnormal proteins.  Image studies (especially MRI) to take pictures of the brain. These can show brain changes that suggest Pick disease or  another brain disorder.  In rare cases, removal of a piece of brain tissue to examine under a microscope (biopsy). This is the most accurate way to diagnose Pick disease.  How is this treated? There is no cure for Pick disease. No treatments can slow the progression of the disease. Support at home is the most important aspect of managing Pick disease. Ask about caregiving resources in the community. Other aspects of management include:  Antidepressants to help with apathy.  Sedative drugs to control aggressive or dangerous behavior.  Occupational therapy to help with home safety and activity.  Speech and language therapy.  Behavioral therapy.  Institutional care, at some point, if needed.  Follow these instructions at home: Changes you can make at home that may help you manage Pick disease include:  Keeping a regular routine.  Avoiding stress or surprises.  Avoiding activities that trigger aggressive behavior.  Following a healthy diet.  Avoiding excess sugar in the diet.  Watching for signs of compulsive eating, which can lead to other health problems.  Avoiding alcohol.  Scheduling regular, enjoyable, and supervised exercise.  Contact a health care provider if:  Symptoms change or get worse.  It becomes more difficult or stressful to be cared for at home. Get help right away if:  It is no longer possible to be cared for at home.  It is dangerous to be cared for at home. This information is not intended to replace advice given to you by your health care provider. Make sure you discuss any questions you have with your health care provider. Document Released: 06/25/2002 Document Revised: 12/11/2015 Document Reviewed: 06/18/2013 Elsevier Interactive Patient Education  2018 Elsevier Inc.  

## 2017-10-14 ENCOUNTER — Ambulatory Visit: Payer: 59

## 2017-10-17 ENCOUNTER — Ambulatory Visit
Admission: RE | Admit: 2017-10-17 | Discharge: 2017-10-17 | Disposition: A | Discharge status: Home / Self Care | Payer: 59 | Source: Ambulatory Visit | Attending: Obstetrics and Gynecology | Admitting: Obstetrics and Gynecology

## 2017-10-17 DIAGNOSIS — Z1231 Encounter for screening mammogram for malignant neoplasm of breast: Secondary | ICD-10-CM

## 2017-12-05 ENCOUNTER — Encounter (INDEPENDENT_AMBULATORY_CARE_PROVIDER_SITE_OTHER): Payer: 59 | Admitting: Ophthalmology

## 2017-12-05 DIAGNOSIS — H33303 Unspecified retinal break, bilateral: Secondary | ICD-10-CM | POA: Diagnosis not present

## 2017-12-05 DIAGNOSIS — H2513 Age-related nuclear cataract, bilateral: Secondary | ICD-10-CM

## 2017-12-05 DIAGNOSIS — H43813 Vitreous degeneration, bilateral: Secondary | ICD-10-CM

## 2017-12-08 ENCOUNTER — Encounter: Payer: Self-pay | Admitting: Neurology

## 2017-12-08 ENCOUNTER — Ambulatory Visit (INDEPENDENT_AMBULATORY_CARE_PROVIDER_SITE_OTHER): Payer: 59 | Admitting: Neurology

## 2017-12-08 VITALS — BP 139/94 | HR 60 | Ht 66.0 in | Wt 132.0 lb

## 2017-12-08 DIAGNOSIS — G4719 Other hypersomnia: Secondary | ICD-10-CM | POA: Diagnosis not present

## 2017-12-08 DIAGNOSIS — G478 Other sleep disorders: Secondary | ICD-10-CM | POA: Diagnosis not present

## 2017-12-08 DIAGNOSIS — G479 Sleep disorder, unspecified: Secondary | ICD-10-CM

## 2017-12-08 DIAGNOSIS — G4762 Sleep related leg cramps: Secondary | ICD-10-CM

## 2017-12-08 NOTE — Patient Instructions (Signed)
We will do a sleep study to look for an organic cause of your sleep disturbance.   Based on your symptoms and your exam I believe we should look for an underlying organic sleep disorder, such as obstructive sleep apnea/OSA, or dream enactments, or periodic leg movement disorder (PLMD), which is leg twitching in sleep. While this is typically associated with symptoms of restless leg syndrome. At this point, we should proceed with a sleep study to further delineate your sleep-related issues.   Your symptoms are not classic for restless legs syndrome.  Our sleep lab administrative assistant will call you to schedule your sleep study. If you don't hear back from her by about 2 weeks from now, please feel free to call her at [phone]. This is her direct line and please leave a message with your phone number to call back if you get the voicemail box. She will call back as soon as possible.
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
## 2017-12-29 ENCOUNTER — Encounter: Payer: Self-pay | Admitting: Neurology

## 2018-01-25 ENCOUNTER — Ambulatory Visit (INDEPENDENT_AMBULATORY_CARE_PROVIDER_SITE_OTHER): Payer: 59 | Admitting: Neurology

## 2018-01-25 DIAGNOSIS — G479 Sleep disorder, unspecified: Secondary | ICD-10-CM

## 2018-01-25 DIAGNOSIS — G478 Other sleep disorders: Secondary | ICD-10-CM

## 2018-01-25 DIAGNOSIS — G4719 Other hypersomnia: Secondary | ICD-10-CM

## 2018-01-25 DIAGNOSIS — G4720 Circadian rhythm sleep disorder, unspecified type: Secondary | ICD-10-CM

## 2018-01-25 DIAGNOSIS — G4710 Hypersomnia, unspecified: Secondary | ICD-10-CM | POA: Diagnosis not present

## 2018-01-25 DIAGNOSIS — G4762 Sleep related leg cramps: Secondary | ICD-10-CM

## 2018-01-27 NOTE — Progress Notes (Signed)
Patient referred by Dr. Clelia CroftShaw, seen by me on 12/08/17 for leg cramps at night, diagnostic PSG on 01/25/18.   Please call and notify the patient that the recent sleep study did not show any significant sleep disorder such as obstructive sleep apnea or PLMs/leg twitching. Got out of bed twice to stretch during a longer period of wakefulness.  Please inform patient that she can FU with PCP.    Thanks,  Huston FoleySaima Sol Englert, MD, PhD [facility])

## 2018-01-27 NOTE — Procedures (Signed)
PATIENT'S NAME:  Donna Ferrell, Donna Ferrell DOB:      October 18, 1960      MR#:    [id number]     DATE OF RECORDING: 01/25/2018 REFERRING M.D.:  Carolin Coy, MD Study Performed:   Baseline Polysomnogram HISTORY: 57 year old woman with a history of reflux disease, osteopenia, chronic neck pain, depression, anxiety, arthritis, memory loss, who reports nocturnal leg cramps for the past several months. The patient endorsed the Epworth Sleepiness Scale at 11 points. The patient's weight 132 pounds with a height of 66 (inches), resulting in a BMI of 21.3 kg/m2 The patient's neck circumference measured 12.5 inches.  CURRENT MEDICATIONS: Xanax, Flexeril, Mobic, Protonix   PROCEDURE:  This is a multichannel digital polysomnogram utilizing the Somnostar 11.2 system.  Electrodes and sensors were applied and monitored per AASM Specifications.   EEG, EOG, Chin and Limb EMG, were sampled at 200 Hz.  ECG, Snore and Nasal Pressure, Thermal Airflow, Respiratory Effort, CPAP Flow and Pressure, Oximetry was sampled at 50 Hz. Digital video and audio were recorded.      BASELINE STUDY  Lights Out was at 21:11 and Lights On at 04:59.  Total recording time (TRT) was 469 minutes, with a total sleep time (TST) of  281.5 minutes.   The patient's sleep latency was 14.5 minutes.  REM latency was 50 minutes, which is mildly reduced. The sleep efficiency was 60 %, which is reduced. Marland Kitchen     SLEEP ARCHITECTURE: WASO (Wake after sleep onset) was 172.5 minutes with mild sleep fragmentation noted and one rather long period of wakefulness, during which the patient got out of bed twice to stretch her legs.  There were 17 minutes in Stage N1, 117 minutes Stage N2, 75 minutes Stage N3 and 72.5 minutes in Stage REM.  The percentage of Stage N1 was 6.%, Stage N2 was 41.6%, Stage N3 was 26.6%, which is increased, and Stage R (REM sleep) was 25.8%. The arousals were noted as: 35 were spontaneous, 1 were associated with PLMs, 3 were associated with respiratory  events.  RESPIRATORY ANALYSIS:  There were a total of 6 respiratory events:  0 obstructive apneas, 1 central apneas and 1 mixed apneas with a total of 2 apneas and an apnea index (AI) of .4 /hour. There were 4 hypopneas with a hypopnea index of .9 /hour. The patient also had 0 respiratory event related arousals (RERAs).      The total APNEA/HYPOPNEA INDEX (AHI) was 1.3/hour and the total RESPIRATORY DISTURBANCE INDEX was 0. 1.3 /hour.  6 events occurred in REM sleep and 0 events in NREM. The REM AHI was 0.5/hour, versus a non-REM AHI of 0. The patient spent 64.5 minutes of total sleep time in the supine position and 217 minutes in non-supine.. The supine AHI was 0.9 versus a non-supine AHI of 1.4.  OXYGEN SATURATION & C02:  The Wake baseline 02 saturation was 98%, with the lowest being 88%. Time spent below 89% saturation equaled 0 minutes.  PERIODIC LIMB MOVEMENTS: The patient had a total of 32 Periodic Limb Movements.  The Periodic Limb Movement (PLM) index was 6.8 and the PLM Arousal index was .2/hour.  Audio and video analysis did not show any abnormal or unusual movements, behaviors, phonations or vocalizations. The patient took 1 bathroom break. Soft, intermittent snoring was noted. The EKG was in keeping with normal sinus rhythm (NSR).  Post-study, the patient indicated that sleep was worse than usual.   IMPRESSION:  1. Nocturnal leg cramps 2. Dysfunctions associated with sleep stages  or arousal from sleep  RECOMMENDATIONS:  1. This study does not demonstrate any significant obstructive or central sleep disordered breathing. This study does not support an intrinsic sleep disorder as a cause of the patient's symptoms. Other causes, including circadian rhythm disturbances, an underlying mood disorder, medication effect and/or an underlying medical problem cannot be ruled out. 2. No significant PLMs (periodic limb movements of sleep) were noted during this study. Patient had two episodes of  getting out of bed due to cramp during wakefulness, no cramps during sleep.  3. This study shows sleep fragmentation and abnormal sleep stage percentages; these are nonspecific findings and per se do not signify an intrinsic sleep disorder or a cause for the patient's sleep-related symptoms. Causes include (but are not limited to) the first night effect of the sleep study, circadian rhythm disturbances, medication effect or an underlying mood disorder or medical problem.  4. The patient should be cautioned not to drive, work at heights, or operate dangerous or heavy equipment when tired or sleepy. Review and reiteration of good sleep hygiene measures should be pursued with any patient. 5. The patient will be advised to follow up with the referring provider, who will be notified of the test results.  I certify that I have reviewed the entire raw data recording prior to the issuance of this report in accordance with the Standards of Accreditation of the American Academy of Sleep Medicine (AASM)   Huston FoleySaima Soriya Worster, MD, PhD Diplomat, American Board of Neurology and Sleep Medicine (Neurology and Sleep Medicine)

## 2018-01-30 ENCOUNTER — Telehealth: Payer: Self-pay

## 2018-01-30 NOTE — Telephone Encounter (Signed)
-----   Message from Huston FoleySaima Athar, MD sent at 01/27/2018  1:01 PM EDT ----- Patient referred by Dr. Clelia CroftShaw, seen by me on 12/08/17 for leg cramps at night, diagnostic PSG on 01/25/18.   Please call and notify the patient that the recent sleep study did not show any significant sleep disorder such as obstructive sleep apnea or PLMs/leg twitching. Got out of bed twice to stretch during a longer period of wakefulness.  Please inform patient that she can FU with PCP.     Thanks,  Huston FoleySaima Athar, MD, PhD [facility]

## 2018-01-30 NOTE — Telephone Encounter (Signed)
Pt returned my call. I advised her that her PSG did not show any significant sleep disorder such as osa or PLMs/leg twitching, although pt got out of bed twice to stretch during a longer period of wakefulness. Dr. Frances FurbishAthar recommends that pt follow up with her PCP. Pt says that she is still having leg cramps that wake her up during the night. She had 5 leg cramps last night. She is asking for an appt to discuss these results in further detail with Dr. Frances FurbishAthar. I offered pt several appts this week but it is out of town. She is asking to be called if something comes available next week. I will watch the schedule for the pt, and she understands that an appt next week is not guaranteed and may be short notice. Pt verbalized understanding of results.

## 2018-01-30 NOTE — Telephone Encounter (Signed)
I called pt to discuss her sleep study results. No answer, left a message asking pt to call me back.  

## 2018-01-31 ENCOUNTER — Encounter: Payer: Self-pay | Admitting: Neurology

## 2018-01-31 NOTE — Telephone Encounter (Signed)
I spoke with Dr. Frances FurbishAthar regarding pt's mychart message. Dr. Frances FurbishAthar asked me to reply to pt's mychart message with her recommendations. I have done so. I will take the pt off of the waitlist.

## 2018-03-06 ENCOUNTER — Ambulatory Visit (INDEPENDENT_AMBULATORY_CARE_PROVIDER_SITE_OTHER): Payer: 59 | Admitting: Sports Medicine

## 2018-03-06 VITALS — BP 114/82 | Ht 66.0 in | Wt 132.0 lb

## 2018-03-06 DIAGNOSIS — R252 Cramp and spasm: Secondary | ICD-10-CM

## 2018-03-06 MED ORDER — GABAPENTIN 100 MG PO CAPS: 100.0000 mg | ORAL_CAPSULE | Freq: Every day | ORAL | 1 refills | Status: DC

## 2018-03-06 NOTE — Patient Instructions (Addendum)
Dr Jess BartersIbazebo [facility] [address] [place] [id number] [phone]

## 2018-03-06 NOTE — Progress Notes (Signed)
   Subjective:    Patient ID: Donna Ferrell, female    DOB: 1960/10/26, 57 y.o.   MRN: [id number]  HPI chief complaint: Right leg cramps  Donna Ferrell comes in today with persistent right leg cramping. It is worse at night. She describes a cramping sensation that starts in the foot and ankle and will then radiate up into her calf. She has a sensitive spot on the lateral aspect of her knee that will reproduce pain down her leg and into her foot if she compresses it. She is status post lumbar decompression done by Dr. Newell CoralNudelman 2 years ago. She has most recently seen neurology and had a sleep study done which was normal. She has tried Flexeril as well as medications for restless leg syndrome but despite this she is having difficulty sleeping. She also notices weakness in the right leg, especially after prolonged sitting. She will also get a slapping gait occasionally when walking. She does endorse some cramping in her left foot as well as but not as severe as on the right.  Interim medical history reviewed Medications reviewed Allergies reviewed    Review of Systems As above    Objective:   Physical Exam  Well-developed, well-nourished. No acute distress. Awake alert and oriented 3. Vital signs reviewed  Right knee: Patient has reproducible pain down the right lower leg and into the foot with compression over the fibular head. She also has 4+/5 strength with resisted dorsiflexion on the right. Good strength with great toe extension and plantar flexion. Good pulses.      Assessment & Plan:   Neuropathic right leg pain Bilateral lower extremity cramping Status post lumbar spine surgery 2 years ago  We need to start with an updated EMG/nerve conduction study. Her symptoms may be originating from the peroneal nerve on the right and not her low back. We will also try a low dose gabapentin, 100 mg daily at bedtime and she may increase this to 300 mg daily at bedtime if tolerated (she has tried  gabapentin in the past but it caused her to feel "loopy"). I will call her with the EMG/nerve conduction study results when available. I did encourage her to keep her appointment with Dr. Newell CoralNudelman as well.

## 2018-03-07 NOTE — Addendum Note (Signed)
Addended by: Annita BrodMOORE, Coyt Govoni C on: 03/07/2018 04:45 PM   Modules accepted: Orders

## 2018-03-17 ENCOUNTER — Telehealth: Payer: Self-pay

## 2018-03-17 ENCOUNTER — Other Ambulatory Visit: Payer: Self-pay

## 2018-03-17 MED ORDER — GABAPENTIN 300 MG PO CAPS: 300.0000 mg | ORAL_CAPSULE | Freq: Every day | ORAL | 1 refills | Status: DC

## 2018-03-17 MED ORDER — GABAPENTIN 100 MG PO CAPS: ORAL_CAPSULE | ORAL | 1 refills | Status: DC

## 2018-03-17 NOTE — Telephone Encounter (Signed)
[facility] need [facility] before she can get the injection. I told pt I would try and contact someone at GI and figure out what we need to do. I will f/u with her via mychart and let he know the status. Pt understands.

## 2018-03-21 ENCOUNTER — Other Ambulatory Visit: Payer: Self-pay

## 2018-03-21 DIAGNOSIS — M792 Neuralgia and neuritis, unspecified: Secondary | ICD-10-CM

## 2018-03-21 MED ORDER — PREDNISONE 10 MG (21) PO TBPK: ORAL_TABLET | Freq: Every day | ORAL | 0 refills | Status: DC

## 2018-04-10 ENCOUNTER — Ambulatory Visit
Admission: RE | Admit: 2018-04-10 | Discharge: 2018-04-10 | Disposition: A | Discharge status: Home / Self Care | Payer: 59 | Source: Ambulatory Visit | Attending: Sports Medicine | Admitting: Sports Medicine

## 2018-04-10 DIAGNOSIS — M792 Neuralgia and neuritis, unspecified: Secondary | ICD-10-CM

## 2018-04-13 ENCOUNTER — Ambulatory Visit (INDEPENDENT_AMBULATORY_CARE_PROVIDER_SITE_OTHER): Payer: 59 | Admitting: Neurology

## 2018-04-13 DIAGNOSIS — R200 Anesthesia of skin: Secondary | ICD-10-CM

## 2018-04-13 DIAGNOSIS — R29898 Other symptoms and signs involving the musculoskeletal system: Secondary | ICD-10-CM

## 2018-04-13 DIAGNOSIS — Z0289 Encounter for other administrative examinations: Secondary | ICD-10-CM

## 2018-04-13 NOTE — Progress Notes (Signed)
Full Name: Donna Ferrell Gender: Female MRN #: [id number] Date of Birth: 2061-01-02    Visit Date: 04/13/18 09:07 Age: 57 Years 10 Months Old Examining Physician: Naomie Dean, MD  Referring Physician: Reino Bellis, DO  History: Right leg numbness and cramping  Summary:  Nerve Conductions Studies were performed on the bilateral lower extremities. EMG was performed on the right lower extremity.     The right peroneal motor nerve(recording at the EDB) showed decreased amplitude (1.63mV, N>2). The right superficial peroneal nerve showed no response. All reaming nerve normal.  The right Peroneus Longus, Tibialis Anterior and Tibialis Posterior muscles showed diminished motor unit recruitment. All remaining muscles normal.  EMG needle exam of the lumbar paraspinals was not performed as those muscles are unreliable after surgery.  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  2. There are chronic neurogenic changes seen in distal right leg muscles sharing L5 innervation. This may be from remote radiculopathy. 3. No acute/ongoing denervation to suggest current lumbar radiculopathy.  Naomie Dean M.D.  [facility] [address] [place], [place] [address] Tel: [phone] Fax: [phone]        [facility]    Nerve / Sites Muscle Latency Ref. Amplitude Ref. Rel Amp Segments Distance Velocity Ref. Area    ms ms mV mV %  cm m/s m/s mVms  R Peroneal - EDB     Ankle EDB 5.4 ?6.5 1.4 ?2.0 100 Ankle - EDB 9   8.6     Fib head EDB 12.7  1.4  95.4 Fib head - Ankle 32 44 ?44 8.1     Pop fossa EDB 15.0  1.4  106 Pop fossa - Fib head 10 44 ?44 8.0         Pop fossa - Ankle      L Peroneal - EDB     Ankle EDB 4.9 ?6.5 3.5 ?2.0 100 Ankle - EDB 9   18.9     Fib head EDB 11.9  3.2  89.3 Fib head - Ankle 32 46 ?44 18.6     Pop fossa EDB 14.1  3.0  94.4 Pop fossa - Fib head 10 46 ?44 14.0         Pop fossa - Ankle 0 0    R Tibial - AH   Ankle AH 4.9 ?5.8 10.8 ?4.0 100 Ankle - AH 9   28.7     Pop fossa AH 13.6  8.0  74.7 Pop fossa - Ankle 38 44 ?41 24.8  L Tibial - AH     Ankle AH 4.7 ?5.8 7.2 ?4.0 100 Ankle - AH 9   29.9     Pop fossa AH 12.9  7.1  97.9 Pop fossa - Ankle 38 46 ?41 28.7  R Peroneal - Tib Ant     Fib Head Tib Ant 3.6 ?4.7 3.6 ?3.0 100 Fib Head - Tib Ant 10   21.1     Pop fossa Tib Ant 5.4  3.2  89 Pop fossa - Fib Head 10 56 ?44 22.2               SNC    Nerve / Sites Rec. Site Peak Lat Ref.  Amp Ref. Segments Distance    ms ms V V  cm  R Sural - Ankle (Calf)     Calf Ankle 3.4 ?4.4 9 ?6 Calf - Ankle 14  L Sural - Ankle (Calf)  Calf Ankle 3.8 ?4.4 8 ?6 Calf - Ankle 14  R Superficial peroneal - Ankle     Lat leg Ankle NR ?4.4 NR ?6 Lat leg - Ankle 14  L Superficial peroneal - Ankle     Lat leg Ankle 4.0 ?4.4 6 ?6 Lat leg - Ankle 14              F  Wave    Nerve F Lat Ref.   ms ms  R Tibial - AH 50.1 ?56.0  L Tibial - AH 49.7 ?56.0         EMG full       EMG Summary Table    Spontaneous MUAP Recruitment  Muscle IA Fib PSW Fasc Other Amp Dur. Poly Pattern  R. Vastus medialis Normal None None None _______ Normal Normal Normal Normal  R. Tibialis anterior Normal None None None _______ Normal Normal Normal Reduced  R. Peroneus longus Normal None None None _______ Normal Normal Normal Reduced  R. Gastrocnemius (Medial head) Normal None None None _______ Normal Normal Normal Normal  R. Biceps femoris (short head) Normal None None None _______ Normal Normal Normal Normal  R. Biceps femoris (long head) Normal None None None _______ Normal Normal Normal Normal  R. Gluteus medius Normal None None None _______ Normal Normal Normal Normal  R. Gluteus maximus Normal None None None _______ Normal Normal Normal Normal  R. Tibialis posterior Normal None None None _______ Normal Normal Normal Reduced

## 2018-04-13 NOTE — Progress Notes (Signed)
See procedure note.

## 2018-04-13 NOTE — Procedures (Signed)
Full Name: Madlynn Lundeen" Smyth Gender: Female MRN #: [id number] Date of Birth: [birth year]    Visit Date: 04/13/18 09:07 Age: 57 Years 10 Months Old Examining Physician: Naomie Dean, MD  Referring Physician: Reino Bellis, DO  History: Right leg numbness and cramping  Summary:  Nerve Conductions Studies were performed on the bilateral lower extremities. EMG was performed on the right lower extremity.     The right peroneal motor nerve(recording at the EDB) showed decreased amplitude (1.85mV, N>2). The right superficial peroneal nerve showed no response. All remaining nerves normal.  The right Peroneus Longus, Tibialis Anterior and Tibialis Posterior muscles showed diminished motor unit recruitment. All remaining muscles normal.  EMG needle exam of the lumbar paraspinals was not performed as those muscles are unreliable after surgery.  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  2. There are chronic neurogenic changes seen in distal right leg muscles sharing L5 innervation. This may be from remote radiculopathy. 3. No acute/ongoing denervation to suggest current lumbar radiculopathy.  Naomie Dean M.D.  [facility] [address] [place], [place] [address] Tel: [phone] Fax: [phone]        [facility]    Nerve / Sites Muscle Latency Ref. Amplitude Ref. Rel Amp Segments Distance Velocity Ref. Area    ms ms mV mV %  cm m/s m/s mVms  R Peroneal - EDB     Ankle EDB 5.4 ?6.5 1.4 ?2.0 100 Ankle - EDB 9   8.6     Fib head EDB 12.7  1.4  95.4 Fib head - Ankle 32 44 ?44 8.1     Pop fossa EDB 15.0  1.4  106 Pop fossa - Fib head 10 44 ?44 8.0         Pop fossa - Ankle      L Peroneal - EDB     Ankle EDB 4.9 ?6.5 3.5 ?2.0 100 Ankle - EDB 9   18.9     Fib head EDB 11.9  3.2  89.3 Fib head - Ankle 32 46 ?44 18.6     Pop fossa EDB 14.1  3.0  94.4 Pop fossa - Fib head 10 46 ?44 14.0         Pop fossa - Ankle 0 0    R Tibial - AH   Ankle AH 4.9 ?5.8 10.8 ?4.0 100 Ankle - AH 9   28.7     Pop fossa AH 13.6  8.0  74.7 Pop fossa - Ankle 38 44 ?41 24.8  L Tibial - AH     Ankle AH 4.7 ?5.8 7.2 ?4.0 100 Ankle - AH 9   29.9     Pop fossa AH 12.9  7.1  97.9 Pop fossa - Ankle 38 46 ?41 28.7  R Peroneal - Tib Ant     Fib Head Tib Ant 3.6 ?4.7 3.6 ?3.0 100 Fib Head - Tib Ant 10   21.1     Pop fossa Tib Ant 5.4  3.2  89 Pop fossa - Fib Head 10 56 ?44 22.2               SNC    Nerve / Sites Rec. Site Peak Lat Ref.  Amp Ref. Segments Distance    ms ms V V  cm  R Sural - Ankle (Calf)     Calf Ankle 3.4 ?4.4 9 ?6 Calf - Ankle 14  L Sural - Ankle (Calf)  Calf Ankle 3.8 ?4.4 8 ?6 Calf - Ankle 14  R Superficial peroneal - Ankle     Lat leg Ankle NR ?4.4 NR ?6 Lat leg - Ankle 14  L Superficial peroneal - Ankle     Lat leg Ankle 4.0 ?4.4 6 ?6 Lat leg - Ankle 14              F  Wave    Nerve F Lat Ref.   ms ms  R Tibial - AH 50.1 ?56.0  L Tibial - AH 49.7 ?56.0         EMG full       EMG Summary Table    Spontaneous MUAP Recruitment  Muscle IA Fib PSW Fasc Other Amp Dur. Poly Pattern  R. Vastus medialis Normal None None None _______ Normal Normal Normal Normal  R. Tibialis anterior Normal None None None _______ Normal Normal Normal Reduced  R. Peroneus longus Normal None None None _______ Normal Normal Normal Reduced  R. Gastrocnemius (Medial head) Normal None None None _______ Normal Normal Normal Normal  R. Biceps femoris (short head) Normal None None None _______ Normal Normal Normal Normal  R. Biceps femoris (long head) Normal None None None _______ Normal Normal Normal Normal  R. Gluteus medius Normal None None None _______ Normal Normal Normal Normal  R. Gluteus maximus Normal None None None _______ Normal Normal Normal Normal  R. Tibialis posterior Normal None None None _______ Normal Normal Normal Reduced

## 2018-04-24 ENCOUNTER — Ambulatory Visit (INDEPENDENT_AMBULATORY_CARE_PROVIDER_SITE_OTHER): Payer: 59 | Admitting: Sports Medicine

## 2018-04-24 ENCOUNTER — Encounter: Payer: Self-pay | Admitting: Sports Medicine

## 2018-04-24 VITALS — BP 120/78 | Ht 66.0 in | Wt 134.0 lb

## 2018-04-24 DIAGNOSIS — M792 Neuralgia and neuritis, unspecified: Secondary | ICD-10-CM | POA: Diagnosis not present

## 2018-04-24 NOTE — Progress Notes (Signed)
Patient ID: Donna Ferrell, female   DOB: May 27, 1961, 57 y.o.   MRN: [id number]  Donna Ferrell comes in today to discuss EMG/nerve conduction study findings and results of a recent MRI of her lumbar spine.  EMG/nerve conduction study shows a right nonlocalizing peroneal neuropathy in addition to chronic neurogenic changes seen from her previous radiculopathy.  MRI of her lumbar spine shows normal postoperative changes with an L5-S1 fusion.  No evidence of right sided neural impingement seen. Physical exam today shows tenderness to palpation over the fibular head with a positive Tinel's over this area as well.  She has 4+/5 strength with resisted ankle dorsiflexion and great toe extension on the right.  Patient admits that some of this may be chronic from her previous radiculopathy.  She is also endorsing a worsening foot drop when walking.  At this point, I think we need to get imaging of the proximal fibula to rule out any sort of compressive lesion that may need surgical decompression around the common peroneal nerve.  If imaging is unremarkable, then a referral back to neurology (Dr. Lucia Gaskins) would be appropriate.  Patient is in agreement with that plan.  Total time of 15 minutes was spent with this patient with greater than 50% of the time spent in face-to-face consultation discussing further work-up of her right peroneal neuropathy.  Of note, patient has increased her gabapentin to 300 mg nightly and has noticed some improvement in bilateral lower extremity cramping.

## 2018-04-26 ENCOUNTER — Ambulatory Visit
Admission: RE | Admit: 2018-04-26 | Discharge: 2018-04-26 | Disposition: A | Discharge status: Home / Self Care | Payer: 59 | Source: Ambulatory Visit | Attending: Sports Medicine | Admitting: Sports Medicine

## 2018-04-26 DIAGNOSIS — M792 Neuralgia and neuritis, unspecified: Secondary | ICD-10-CM

## 2018-05-05 ENCOUNTER — Other Ambulatory Visit: Payer: 59

## 2018-05-05 ENCOUNTER — Ambulatory Visit
Admission: RE | Admit: 2018-05-05 | Discharge: 2018-05-05 | Disposition: A | Discharge status: Home / Self Care | Payer: 59 | Source: Ambulatory Visit | Attending: Sports Medicine | Admitting: Sports Medicine

## 2018-05-05 DIAGNOSIS — M792 Neuralgia and neuritis, unspecified: Secondary | ICD-10-CM

## 2018-05-15 ENCOUNTER — Telehealth: Payer: Self-pay | Admitting: Sports Medicine

## 2018-05-15 ENCOUNTER — Telehealth: Payer: Self-pay

## 2018-05-15 DIAGNOSIS — R252 Cramp and spasm: Secondary | ICD-10-CM

## 2018-05-15 DIAGNOSIS — M792 Neuralgia and neuritis, unspecified: Secondary | ICD-10-CM

## 2018-05-15 NOTE — Telephone Encounter (Signed)
Donna Ferrell was notified via telephone today of her MRI findings of her right tib-fib.  There is no obvious compressive lesion at the fibular head.  I recommend consultation with Dr. Daisy Blossom.  MRIs of both the lumbar spine and the right tib-fib do not give an explanation for her peroneal neuropathy.  I would defer further work-up and treatment to the discretion of Dr. Daisy Blossom and patient will follow-up with me as needed.

## 2018-05-15 NOTE — Telephone Encounter (Signed)
Referral placed to Dr. Lucia Gaskins. Pt agrees with the plan.

## 2018-07-05 ENCOUNTER — Encounter

## 2018-07-05 ENCOUNTER — Ambulatory Visit (INDEPENDENT_AMBULATORY_CARE_PROVIDER_SITE_OTHER): Payer: 59 | Admitting: Neurology

## 2018-07-05 ENCOUNTER — Encounter: Payer: Self-pay | Admitting: Neurology

## 2018-07-05 VITALS — BP 131/86 | HR 46 | Ht 66.0 in | Wt 134.0 lb

## 2018-07-05 DIAGNOSIS — M21371 Foot drop, right foot: Secondary | ICD-10-CM | POA: Diagnosis not present
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
## 2018-07-06 ENCOUNTER — Ambulatory Visit (INDEPENDENT_AMBULATORY_CARE_PROVIDER_SITE_OTHER): Payer: 59 | Admitting: Neurology

## 2018-07-06 DIAGNOSIS — G5730 Lesion of lateral popliteal nerve, unspecified lower limb: Secondary | ICD-10-CM

## 2018-07-06 DIAGNOSIS — Z0289 Encounter for other administrative examinations: Secondary | ICD-10-CM

## 2018-07-06 DIAGNOSIS — G5701 Lesion of sciatic nerve, right lower limb: Secondary | ICD-10-CM

## 2018-07-12 ENCOUNTER — Telehealth: Payer: Self-pay | Admitting: Neurology

## 2018-07-12 DIAGNOSIS — G5701 Lesion of sciatic nerve, right lower limb: Secondary | ICD-10-CM

## 2018-07-12 DIAGNOSIS — G5730 Lesion of lateral popliteal nerve, unspecified lower limb: Secondary | ICD-10-CM

## 2018-07-12 NOTE — Progress Notes (Signed)
See procedure note.

## 2018-07-12 NOTE — Telephone Encounter (Signed)
Bethany: FYI this is what I sent to referrals just in case patient calls:   Patient would [facility] but I am not sure this is within his scope of practice (she has common peroneal nerve impingement at the fibular head of the right lower leg). I would call Dr. Verlee RossettiElsner's nurse and check before we send the referral or ask her if anyone there deals with the peroneal nerve disorders or if she has to go to orthopaedics and call patient if she needs to be seen elsewhere.
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
## 2018-07-12 NOTE — Progress Notes (Signed)
      Full Name: Donna Ferrell Gender: Female MRN #: [id number] Date of Birth: 12/04/1960    Visit Date: 07/06/18 09:48 Age: 57 Years 1 Months Old Examining Physician: Antonia Ahern, MD  Referring Physician: Ahern, MD  History: Patient with right Idiopathic Peroneal Neuropathy  Summary:  The right Peroneal motor nerve showed delayed distal peak latency(8ms, N<6.5) and reduced amplitude (1.1mV, N>2) and a 10m/s drop in conduction velocity across the fibular head (N<10). The right superficial peroneal sensory nerve showed no response.   The right Peroneal Longus showed poly-phasic motor units, diminished recruitment. The right Tibialis Anterior showed increased amplitude and diminished recruitment.    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 There are chronic neurogenic changes in the Tibialis Anterior and Peroneal Longus with evidence of reinnervation. No acute/ongoing denervation seen at this time but cannot rule out given patient's clinical worsening. This study is consistent with more extensive emg/ncs performed 04/13/2018.   Toni Ahern M.D.  [facility] [address] [place], [place] [address] Tel: [phone] Fax: [phone]        [facility]    Nerve / Sites Muscle Latency Ref. Amplitude Ref.  Ref. Segments Distance Velocity Ref. Area    ms ms mV mV  %  cm m/s m/s mVms  R Peroneal - EDB     Ankle EDB 8.0 ?6.5 1.1 ?2.0   Ankle - EDB 9   4.4     Fib head EDB 15.2  1.2    Fib head - Ankle 29 40 ?44 4.9     Pop fossa EDB 18.4  1.2    Pop fossa - Fib head 10 30 ?44 5.2          Pop fossa - Ankle      R Peroneal - EDB INCHING STUDY     35cm EDB 15.7  1.1   ?0             35cm - EDB 35   5.3     37cm EDB 15.8  1.2    35cm - 37cm 2   6.0     39cm EDB 15.9  1.2    37cm - 39cm 2   6.0     41cm EDB 16.5  1.2    39cm - 41cm 2   5.7            SNC    Nerve / Sites Rec. Site Peak Lat Ref.   Amp Ref. Segments Distance    ms ms V V  cm  R Superficial peroneal - Ankle     Lat leg Ankle NR ?4.4 NR ?6 Lat leg - Ankle 14       F  Wave    Nerve F Lat Ref.   ms ms  R Peroneal - EDB 62.8 ?56.0       EMG full       EMG Summary Table    Spontaneous MUAP Recruitment  Muscle IA Fib PSW Fasc Other Amp Dur. Poly Pattern  R. Vastus medialis Normal None None None _______ Normal Normal Normal Normal  R. Peroneus longus Normal None None None _______ Normal Normal +1 Reduced  R. Tibialis anterior Normal None None None _______ Increased Normal Normal Reduced  R. Gastrocnemius (Medial head) Normal None None None _______ Normal Normal Normal Normal  R. Tibialis posterior Normal None None None _______ Normal Normal Normal Normal  R. Biceps   femoris (long head) Normal None None None _______ Normal Normal Normal Normal  R. Biceps femoris (short head) Normal None None None _______ Normal Normal Normal Normal

## 2018-07-12 NOTE — Procedures (Signed)
Full Name: Donna Ferrell Gender: Female MRN #: [id number] Date of Birth: [birth year]    Visit Date: 07/06/18 09:48 Age: [age] Years 1 Months Old Examining Physician: Naomie DeanAntonia Renesmay Nesbitt, MD  Referring Physician: Lucia GaskinsAhern, MD  History: Patient with right Idiopathic Peroneal Neuropathy  Summary:  The right Peroneal motor nerve showed delayed distal peak latency(798ms, N<6.5) and reduced amplitude (1.171mV, N>2) and a 552m/s drop in conduction velocity across the fibular head (N<10). The right superficial peroneal sensory nerve showed no response.   The right Peroneal Longus showed poly-phasic motor units, diminished recruitment. The right Tibialis Anterior showed increased amplitude and diminished recruitment.    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 There are chronic neurogenic changes in the Tibialis Anterior and Peroneal Longus with evidence of reinnervation. No acute/ongoing denervation seen at this time but cannot rule out given patient's clinical worsening. This study is consistent with more extensive emg/ncs performed 04/13/2018.   Artemio Alyoni Aundraya Dripps M.D.  [facility] [address] [place], [place] [address] Tel: [phone] Fax: [phone]        [facility]    Nerve / Sites Muscle Latency Ref. Amplitude Ref.  Ref. Segments Distance Velocity Ref. Area    ms ms mV mV  %  cm m/s m/s mVms  R Peroneal - EDB     Ankle EDB 8.0 ?6.5 1.1 ?2.0   Ankle - EDB 9   4.4     Fib head EDB 15.2  1.2    Fib head - Ankle 29 40 ?44 4.9     Pop fossa EDB 18.4  1.2    Pop fossa - Fib head 10 30 ?44 5.2          Pop fossa - Ankle      R Peroneal - EDB INCHING STUDY     35cm EDB 15.7  1.1   ?0             35cm - EDB 35   5.3     37cm EDB 15.8  1.2    35cm - 37cm 2   6.0     39cm EDB 15.9  1.2    37cm - 39cm 2   6.0     41cm EDB 16.5  1.2    39cm - 41cm 2   5.7            SNC    Nerve / Sites Rec. Site Peak Lat Ref.   Amp Ref. Segments Distance    ms ms V V  cm  R Superficial peroneal - Ankle     Lat leg Ankle NR ?4.4 NR ?6 Lat leg - Ankle 14       F  Wave    Nerve F Lat Ref.   ms ms  R Peroneal - EDB 62.8 ?56.0       EMG full       EMG Summary Table    Spontaneous MUAP Recruitment  Muscle IA Fib PSW Fasc Other Amp Dur. Poly Pattern  R. Vastus medialis Normal None None None _______ Normal Normal Normal Normal  R. Peroneus longus Normal None None None _______ Normal Normal +1 Reduced  R. Tibialis anterior Normal None None None _______ Increased Normal Normal Reduced  R. Gastrocnemius (Medial head) Normal None None None _______ Normal Normal Normal Normal  R. Tibialis posterior Normal None None None _______ Normal Normal Normal Normal  R. Biceps  femoris (long head) Normal None None None _______ Normal Normal Normal Normal  R. Biceps femoris (short head) Normal None None None _______ Normal Normal Normal Normal

## 2018-07-13 NOTE — Telephone Encounter (Signed)
[facility], Dr. Tonia BroomsElsners referral coor. asking if this is something they would see.

## 2018-07-14 NOTE — Telephone Encounter (Signed)
Made referral to Dr. Danielle DessElsner, with [facility] (pt preference).

## 2018-07-14 NOTE — Telephone Encounter (Signed)
Spoke to [place], at [facility].  I asked if referral would be appropriate and she said to make referral and they would review and then let us know.  (I told her that we were trying to save a step thru phone call, but still wanted referral sent).

## 2018-07-14 NOTE — Addendum Note (Signed)
Addended by: Guy BeginYOUNG, Rethel Sebek S on: 07/14/2018 10:27 AM   Modules accepted: Orders

## 2018-07-19 DIAGNOSIS — E7800 Pure hypercholesterolemia, unspecified: Secondary | ICD-10-CM

## 2018-07-19 HISTORY — DX: Pure hypercholesterolemia, unspecified: E78.00

## 2018-07-19 HISTORY — PX: CATARACT EXTRACTION: SUR2

## 2018-07-25 ENCOUNTER — Ambulatory Visit: Payer: 59 | Attending: Neurology | Admitting: Physical Therapy

## 2018-07-25 DIAGNOSIS — M6281 Muscle weakness (generalized): Secondary | ICD-10-CM | POA: Diagnosis present

## 2018-07-25 DIAGNOSIS — R2689 Other abnormalities of gait and mobility: Secondary | ICD-10-CM | POA: Diagnosis present

## 2018-07-25 NOTE — Patient Instructions (Signed)
Practice picking up marbles with toes   Dorsiflexion: Resisted    Facing anchor, tubing around left foot, pull toward face.  Repeat _10___ times per set. Do __3__ sets per session. Do _1-2___ sessions per day.    Heel Raise: Unilateral (Standing)    Balance on left foot, then rise on ball of foot. Repeat _10___ times per set. Do __3__ sets per session. Do __1-2__ sessions per day.   Practice walking on tip toes and on heels      Inversion: Resisted    Cross legs with right leg underneath, foot in tubing loop. Hold tubing around other foot to resist and turn foot in. Repeat _10___ times per set. Do _3___ sets per session. Do _1-2___ sessions per day.  http://orth.exer.us/13    Eversion: Resisted    With right foot in tubing loop, hold tubing around other foot to resist and turn foot out. Repeat _10___ times per set. Do _3___ sets per session. Do _1-2__ sessions per day.

## 2018-07-26 ENCOUNTER — Encounter: Payer: Self-pay | Admitting: Physical Therapy

## 2018-07-26 ENCOUNTER — Other Ambulatory Visit: Payer: Self-pay
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
## 2018-07-31 ENCOUNTER — Other Ambulatory Visit: Payer: Self-pay | Admitting: *Deleted

## 2018-07-31 ENCOUNTER — Ambulatory Visit: Payer: 59 | Admitting: Physical Therapy

## 2018-07-31 ENCOUNTER — Telehealth: Payer: Self-pay | Admitting: Physical Therapy

## 2018-07-31 DIAGNOSIS — R2689 Other abnormalities of gait and mobility: Secondary | ICD-10-CM

## 2018-07-31 DIAGNOSIS — M6281 Muscle weakness (generalized): Secondary | ICD-10-CM | POA: Diagnosis not present

## 2018-07-31 DIAGNOSIS — G5702 Lesion of sciatic nerve, left lower limb: Secondary | ICD-10-CM

## 2018-07-31 NOTE — Telephone Encounter (Signed)
Dr. Lucia Gaskins, I am seeing Donna Ferrell (MR# [id number]) in PT for RLE strengthening due to Rt peroneal neuropathy.  She would benefit from an AFO (a posterior leaf splint) due to her decreased dorsiflexion in swing and foot slap in stance due to decreased eccentric DF strength; we trialed one today in therapy and her gait is much improved.  If you agree, please submit order in Epic under referrals for DME (list AFO in comments) or fax to [facility] at [phone]; she leaves Sunday, 09-06-18 for [place] for her cycling trip and we would like to TRY to get this before her trip (probably a long shot, but I am going to try) if you agree.    Thank you so much, Kerry Fort, PT [facility] [address]  [place], [place] [address] [phone] 07-31-18/10:00 am

## 2018-07-31 NOTE — Telephone Encounter (Signed)
Toma Copier, can you place this order please and let Ms. Dilday know? Thanks!

## 2018-08-01 ENCOUNTER — Encounter: Payer: 59 | Admitting: Physical Therapy
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
## 2018-08-22 ENCOUNTER — Ambulatory Visit (INDEPENDENT_AMBULATORY_CARE_PROVIDER_SITE_OTHER): Payer: 59 | Admitting: Sports Medicine

## 2018-08-22 ENCOUNTER — Ambulatory Visit: Payer: Self-pay

## 2018-08-22 ENCOUNTER — Ambulatory Visit: Payer: 59 | Attending: Neurology | Admitting: Physical Therapy

## 2018-08-22 ENCOUNTER — Encounter: Payer: Self-pay | Admitting: Sports Medicine

## 2018-08-22 VITALS — BP 128/80 | Ht 66.0 in | Wt 134.0 lb

## 2018-08-22 DIAGNOSIS — M6281 Muscle weakness (generalized): Secondary | ICD-10-CM | POA: Diagnosis present

## 2018-08-22 DIAGNOSIS — M25521 Pain in right elbow: Secondary | ICD-10-CM

## 2018-08-22 DIAGNOSIS — R2689 Other abnormalities of gait and mobility: Secondary | ICD-10-CM

## 2018-08-22 DIAGNOSIS — M7711 Lateral epicondylitis, right elbow: Secondary | ICD-10-CM | POA: Diagnosis not present

## 2018-08-22 MED ORDER — METHYLPREDNISOLONE ACETATE 40 MG/ML IJ SUSP
40.0000 mg | Freq: Once | INTRAMUSCULAR | Status: AC
  Administered 2018-08-22: 40 mg via INTRA_ARTICULAR

## 2018-08-22 NOTE — Patient Instructions (Signed)
Thank you for coming to see us today in clinic.  You were seen today for your right elbow.  We believe you have lateral epicondylitis into the right side.  We gave you an injection into the elbow today.  Hopefully this will be some lasting relief.  We will also give you home exercise to work on in the interim.  We will see back in 4 to 5 weeks for follow-up.

## 2018-08-22 NOTE — Patient Instructions (Signed)
Walking on Heels    Walk on heels for _20__ feet while continuing on a straight path. Do _1-2__ sessions per day.   Hip Extension (All-Fours)    Lift right leg back with knee slightly flexed. Do not arch neck or back. Repeat _10___ times per set. Do _3___ sets per session. Do __1-2__ sessions per day.  KEEP knee bent at 90 degrees and foot flexed - push foot toward ceiling - keep ankle at 90 degrees  Also pull leg in, push it ouf   Strengthening: Hip Extension - Resisted    With tubing around right ankle, face anchor and pull leg straight back. Repeat __10-15__ times per set. Do __1__ sets per session. Do __1__ sessions per day.  First stepping back for the movement, then progress to the exercise   http://orth.exer.us/107     Hip Extension: 2-4 Inches    Tighten gluteal muscle. Lift one leg _10__ times. Restabilize pelvis. Repeat with other leg. Keep pelvis still. Be sure pelvis does not rotate and back does not arch. Do _3__ sets, _1-2__ times per day.  http://ss.exer.us/63   Copyright  VHI. All rights reserved.

## 2018-08-22 NOTE — Progress Notes (Signed)
HPI  CC: Elbow pain Donna Ferrell is a 58 year old female who presents today for right elbow pain.  She states the pain is been present for around 1 to 2 months.  She states she noticed the pain immediately after she was doing some work with a Marine scientist.  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  She states she remembers 1 [place] event where she felt a sharp pain in her elbow, and the pain is been present since that time.  She is been working on stretching the area, which is not helped much.  She has not taken any oral medications.  She states the pain is made worse with any kind of extension of the wrist or supination/pronation of the wrist.  She denies any numbness and tingling down her hand.  She denies any weakness in the hand.  She has no prior injury to the area.  She denies any fevers or chills.  See HPI and/or previous note for associated ROS.  Objective: BP 128/80   Ht 5\' 6"  (1.676 m)   Wt 134 lb (60.8 kg)   LMP 01/26/2016 (Exact Date) Comment: very irregular   Aug 22 , 2016  BMI 21.63 kg/m  Gen: Right-Hand Dominant. NAD, well groomed, a/o x3, normal affect.  CV: Well-perfused. Warm.  Resp: Non-labored.  Neuro: Sensation intact throughout. No gross coordination deficits.   Right elbow exam: No erythema, warmth, swelling noted.  Tenderness palpation of the lateral epicondyle.  Full range of motion in flexion extension of the elbow.  Full range of motion of flexion-extension of the wrist.  Pain with resisted extension of the wrist.  Strength out of 5 throughout testing.  Negative UCL milk test.  ULTRASOUND: Elbow, right Diagnostic limited ultrasound imaging obtained of patient's right elbow.   -Lateral epicondyle: Extensor bundle visualized in long and short axis at the attachment to the lateral epicondyle.  Large gapping noted at the deep insertion of the extensor bundle into the lateral epicondyle.  Hypoechoic changes noted throughout tendon.  Associated fluid  near the Site. IMPRESSION: findings consistent with lateral epicondylitis with associated partial tear.  Assessment and plan: Right-sided lateral epicondylitis   INJECTION: Patient was given informed consent, signed copy in the chart. Appropriate time out was taken. Area prepped and draped in usual sterile fashion. 1 cc of methylprednisolone 40 mg/ml plus  1 cc of 1% lidocaine without epinephrine was injected into the right lateral epicondyle using a(n) lateral approach. The patient tolerated the procedure well. There were no complications. Post procedure instructions were given.  We discussed treatment options at today's visit.  Ultrasound performed as noted above.  Believe she has a partial tear at the attachment of the extensor bundle at the lateral epicondyle.  I did a steroid injection as noted above.  She tolerated the procedure well.  I will provide her with lateral epicondyle exercises at today's visit.  We will see her back in 4 to 5 weeks for follow-up.  If she has no improvement that time, I will consider doing formal physical therapy versus referral to Ortho surgery for further evaluation.   Alric Quan, MD [facility] Sports Medicine Fellow 08/22/2018 2:42 PM  Patient seen and evaluated with the sports medicine fellow.  I agree with the above plan of care.  Ultrasound evaluation today shows partial tearing of the common extensor tendon.  Cortisone injection was administered today and she will start a home exercise program.  Follow-up in 4 to 5  weeks for reevaluation.  Call with questions or concerns in the interim.

## 2018-08-23 ENCOUNTER — Encounter: Payer: Self-pay | Admitting: Sports Medicine
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
## 2018-09-05 ENCOUNTER — Other Ambulatory Visit: Payer: Self-pay | Admitting: Obstetrics and Gynecology

## 2018-09-05 DIAGNOSIS — Z1231 Encounter for screening mammogram for malignant neoplasm of breast: Secondary | ICD-10-CM

## 2018-09-06 ENCOUNTER — Ambulatory Visit: Payer: 59 | Admitting: Physical Therapy

## 2018-09-06 ENCOUNTER — Telehealth: Payer: Self-pay | Admitting: *Deleted

## 2018-09-06 DIAGNOSIS — M6281 Muscle weakness (generalized): Secondary | ICD-10-CM | POA: Diagnosis not present

## 2018-09-06 DIAGNOSIS — R2689 Other abnormalities of gait and mobility: Secondary | ICD-10-CM

## 2018-09-06 NOTE — Telephone Encounter (Signed)
Received order form for pt's right AFO. Order form completed, signed and included office notes/EMG results per [facility]'s request for the AFO that was ordered by Dr. Lucia Gaskins. Faxed these back to [facility]. Received a receipt of confirmation.

## 2018-09-07 ENCOUNTER — Encounter: Payer: Self-pay | Admitting: Physical Therapy
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
## 2018-09-17 HISTORY — PX: PERONEAL NERVE DECOMPRESSION: SHX2226

## 2018-09-25 ENCOUNTER — Encounter: Payer: Self-pay | Admitting: Sports Medicine

## 2018-09-25 ENCOUNTER — Ambulatory Visit (INDEPENDENT_AMBULATORY_CARE_PROVIDER_SITE_OTHER): Payer: 59 | Admitting: Sports Medicine

## 2018-09-25 VITALS — BP 138/82 | Ht 66.0 in | Wt 134.0 lb

## 2018-09-25 DIAGNOSIS — R252 Cramp and spasm: Secondary | ICD-10-CM

## 2018-09-25 DIAGNOSIS — M7711 Lateral epicondylitis, right elbow: Secondary | ICD-10-CM

## 2018-09-25 MED ORDER — SALMETEROL XINAFOATE 50 MCG/DOSE IN AEPB: 1.0000 | INHALATION_SPRAY | Freq: Every day | RESPIRATORY_TRACT | 2 refills | Status: DC

## 2018-09-25 NOTE — Progress Notes (Signed)
   Subjective:    Patient ID: Donna Ferrell, female    DOB: Sep 24, 1960, 58 y.o.   MRN: [id number]  HPI   Donna Ferrell comes in today for follow-up on right elbow pain.  Elbow pain completely resolved with a recent cortisone injection.  She has been doing her home exercises.  She is pleased with her progress in regards to this. Main complaint is chronic nocturnal leg cramping.  This has been going on for several years.  She has tried several different treatments including treatment for restless leg syndrome and making sure she is adequately hydrated.  A friend of hers who is an Administrator, arts in [place] recommended trying a long-acting beta agonist or possibly quinine.  She is very frustrated by the symptoms and would be interested in speaking with a sleep specialist.  She is actually scheduled to undergo knee surgery tomorrow.  Dr. Danielle Dess is going to perform a procedure for "carpal tunnel of the knee".    Review of Systems As above    Objective:   Physical Exam  Well-developed, well-nourished.  No acute distress.  Awake alert and oriented x3.  Vital signs reviewed.  Right elbow: Full range of motion.  No effusion.  No soft tissue swelling.  No tenderness over the lateral epicondyle.  No pain with resisted ECRB testing.  Good grip strength.  Good pulses.      Assessment & Plan:   Resolved right elbow pain secondary to partial thickness common extensor tendon tear Chronic nocturnal leg cramping  I explained to the patient that I am not comfortable prescribing quinine but we can try a long-acting beta agonist at night to see if that will help with her symptoms.  I do think she would benefit from a referral to Dr. Dolan Amen sleep clinic.  For now, we will wait on that referral since she is scheduled for surgery tomorrow.  I have asked [name] to contact me in one week to let me know how she is doing postoperatively and we can see how she responds to the long-acting beta agonist.  At that time, we  can reconsider referral to Dr. Lewie Chamber as well as some initial blood work such as a CK.

## 2018-09-26 DIAGNOSIS — G5731 Lesion of lateral popliteal nerve, right lower limb: Secondary | ICD-10-CM | POA: Diagnosis not present

## 2018-10-02 DIAGNOSIS — R82998 Other abnormal findings in urine: Secondary | ICD-10-CM | POA: Diagnosis not present

## 2018-10-02 DIAGNOSIS — Z0000 Encounter for general adult medical examination without abnormal findings: Secondary | ICD-10-CM | POA: Diagnosis not present

## 2018-10-09 DIAGNOSIS — Z0000 Encounter for general adult medical examination without abnormal findings: Secondary | ICD-10-CM | POA: Diagnosis not present

## 2018-10-09 DIAGNOSIS — G4700 Insomnia, unspecified: Secondary | ICD-10-CM | POA: Insufficient documentation

## 2018-10-14 IMAGING — CR DG HIP (WITH OR WITHOUT PELVIS) 2-3V*R*
2 series · 2 of 2 positions shown · non-contrast
Comparison: None.

CLINICAL DATA: Closed pelvic rami fractures from fall.  Follow-up.

EXAM:
DG HIP (WITH OR WITHOUT PELVIS) 2-3V RIGHT

[w pelvis *]
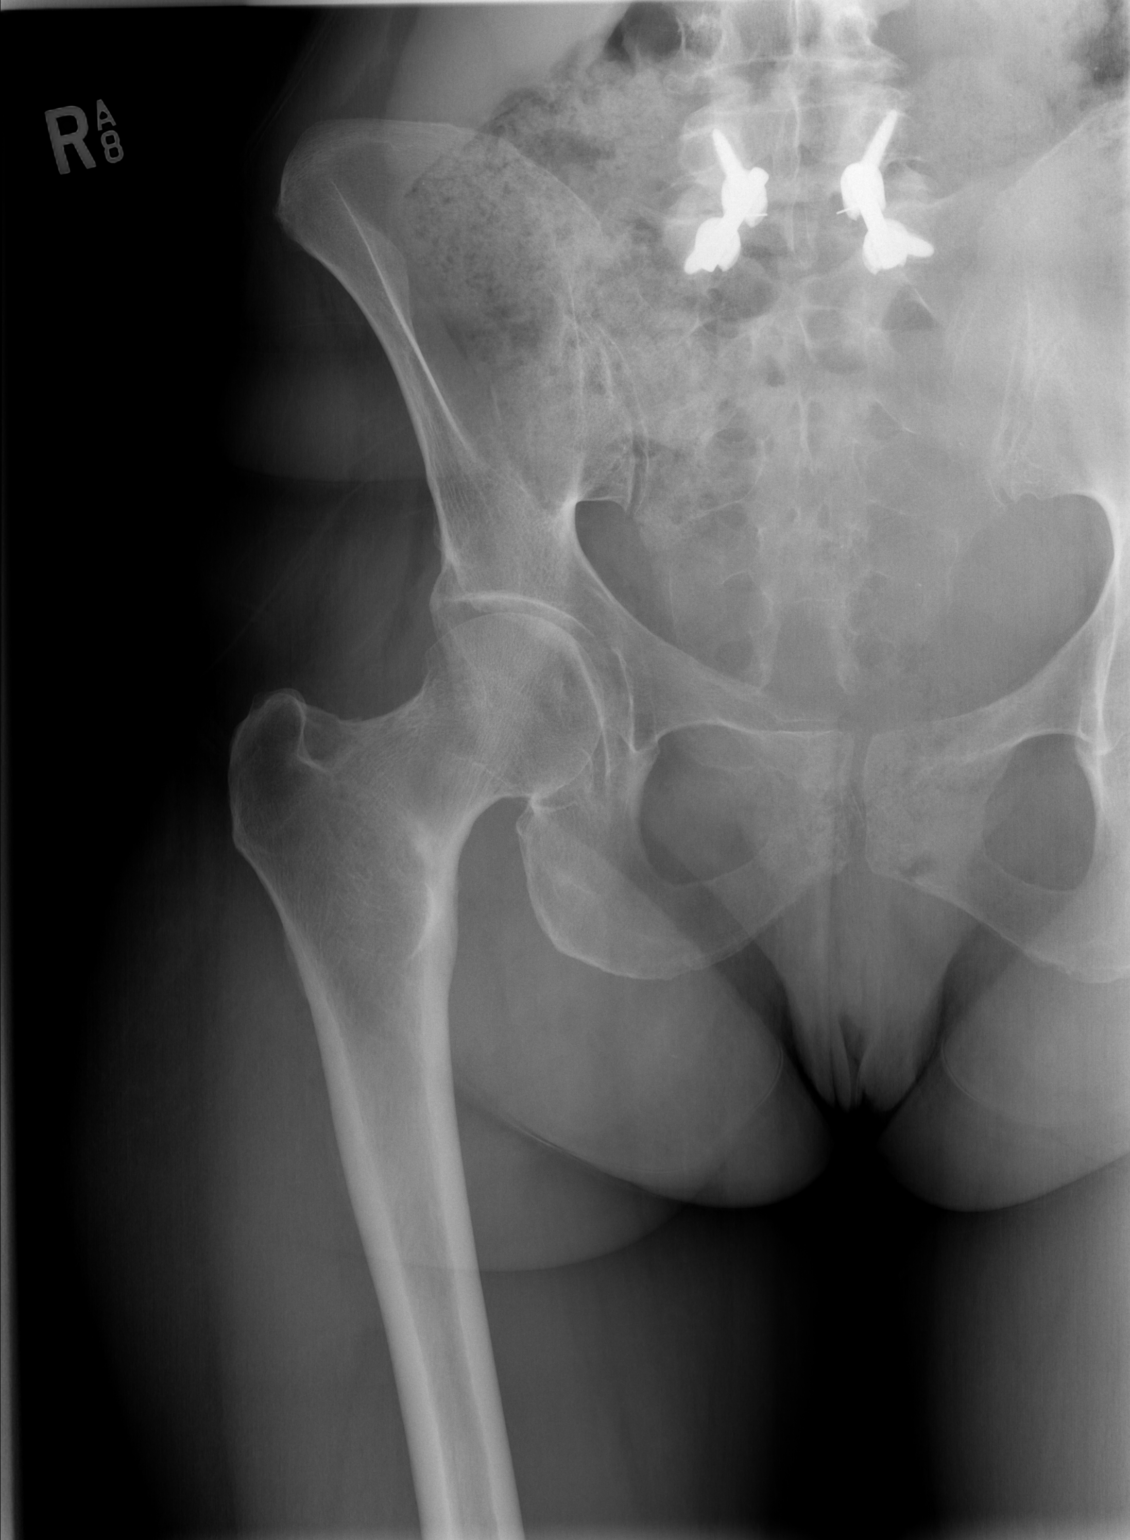

[t hip ap right]
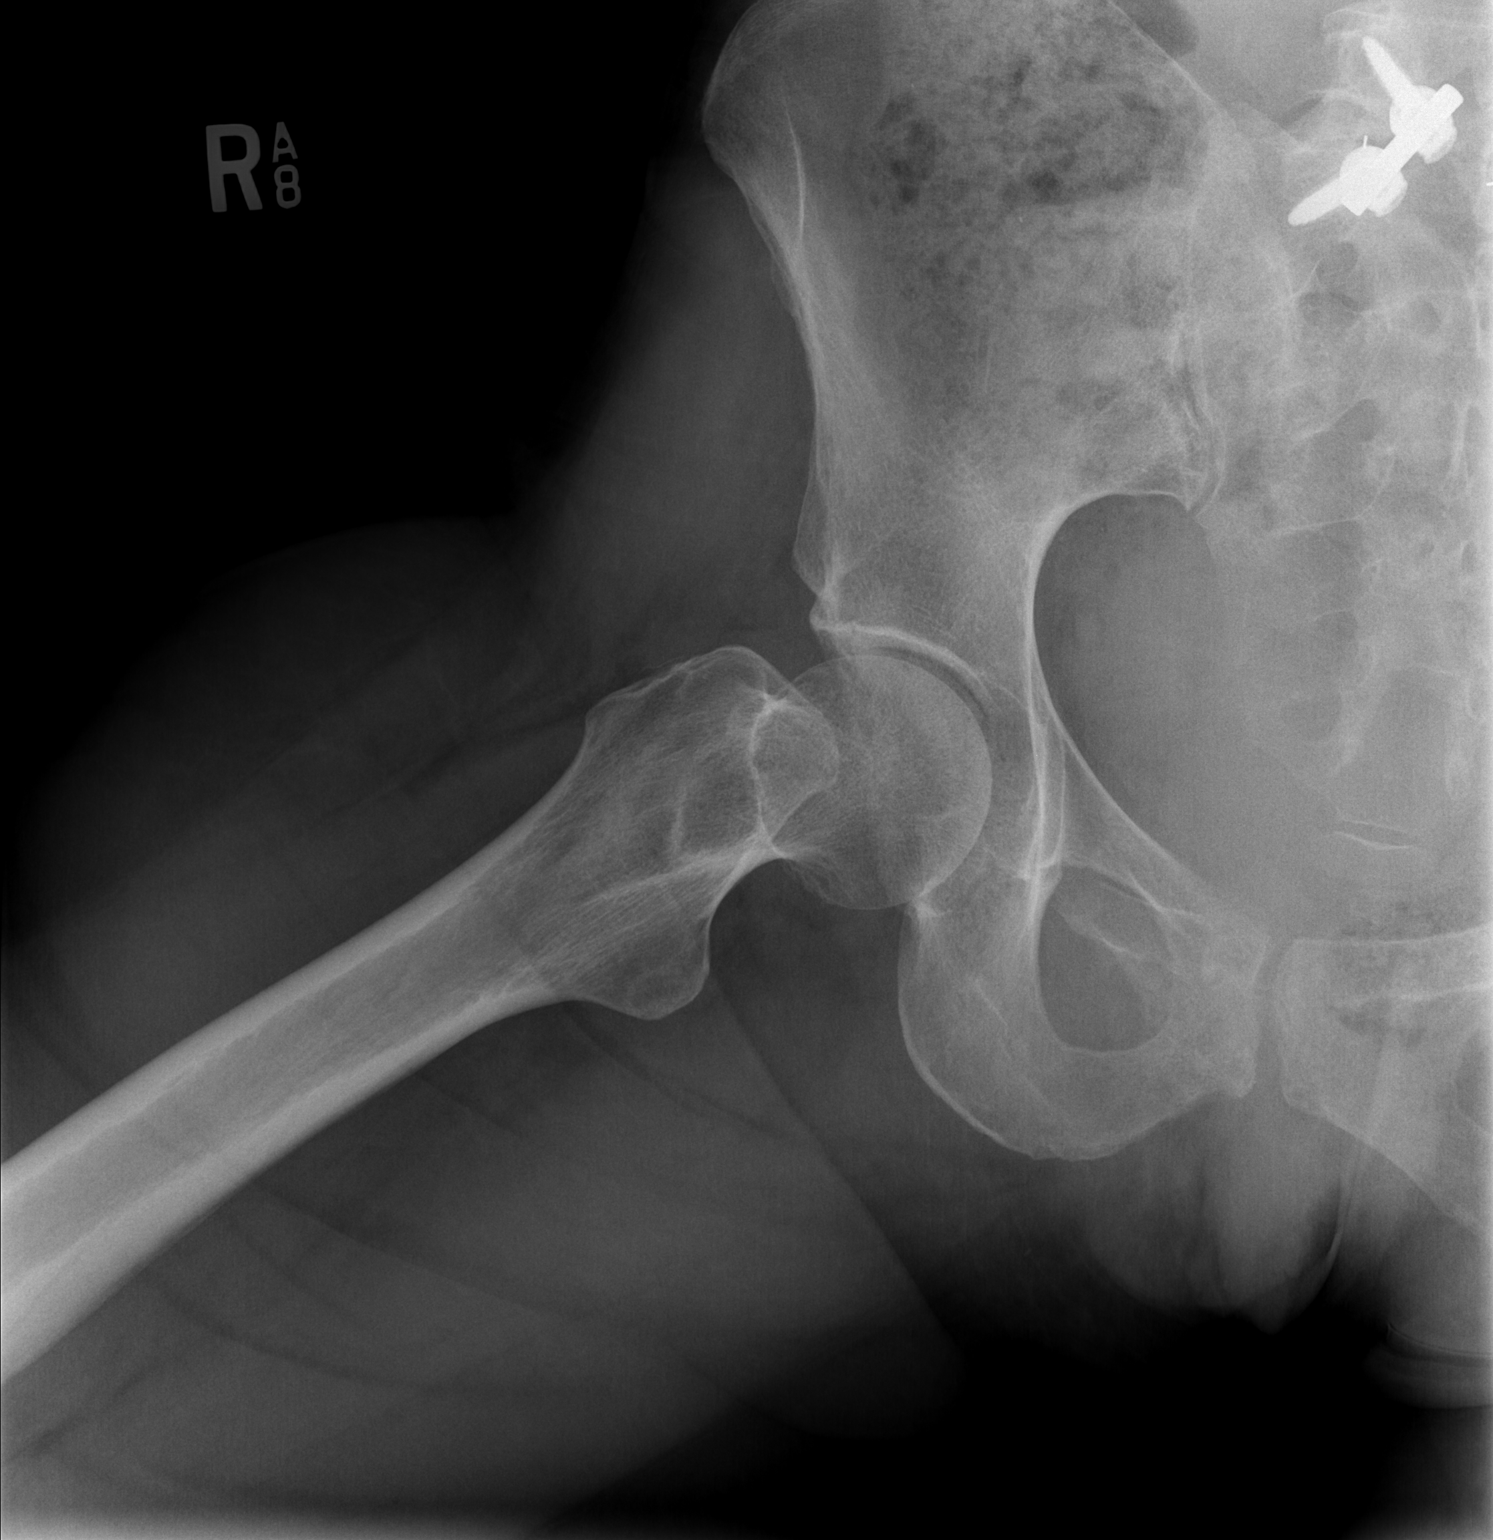

[2 of 2 positions shown; findings below may reference images not displayed]

FINDINGS: Fractures/deformity noted in the right pubic bone and superior pubic
ramus. This is age i femoral abnormality on the right. No
subluxation or dislocation. Ndeterminate. No proximal
IMPRESSION: Age-indeterminate fracture deformity of the right pubic bone and
superior pubic ramus.

## 2018-10-16 ENCOUNTER — Other Ambulatory Visit: Payer: Self-pay | Admitting: Internal Medicine

## 2018-10-16 DIAGNOSIS — E785 Hyperlipidemia, unspecified: Secondary | ICD-10-CM

## 2018-10-20 ENCOUNTER — Ambulatory Visit: Payer: 59 | Admitting: Obstetrics and Gynecology

## 2018-10-20 ENCOUNTER — Ambulatory Visit: Payer: 59

## 2018-10-26 DIAGNOSIS — R112 Nausea with vomiting, unspecified: Secondary | ICD-10-CM | POA: Diagnosis not present

## 2018-11-17 IMAGING — DX DG HIP (WITH OR WITHOUT PELVIS) 2-3V*R*
2 series · 2 of 2 positions shown · non-contrast
Comparison: 06/03/2017

CLINICAL DATA: Follow-up pubic rami fracture

EXAM:
DG HIP (WITH OR WITHOUT PELVIS) 2-3V RIGHT

[dg hip unilat w or w/o pelvis 2-3 views  (1 of 2)]
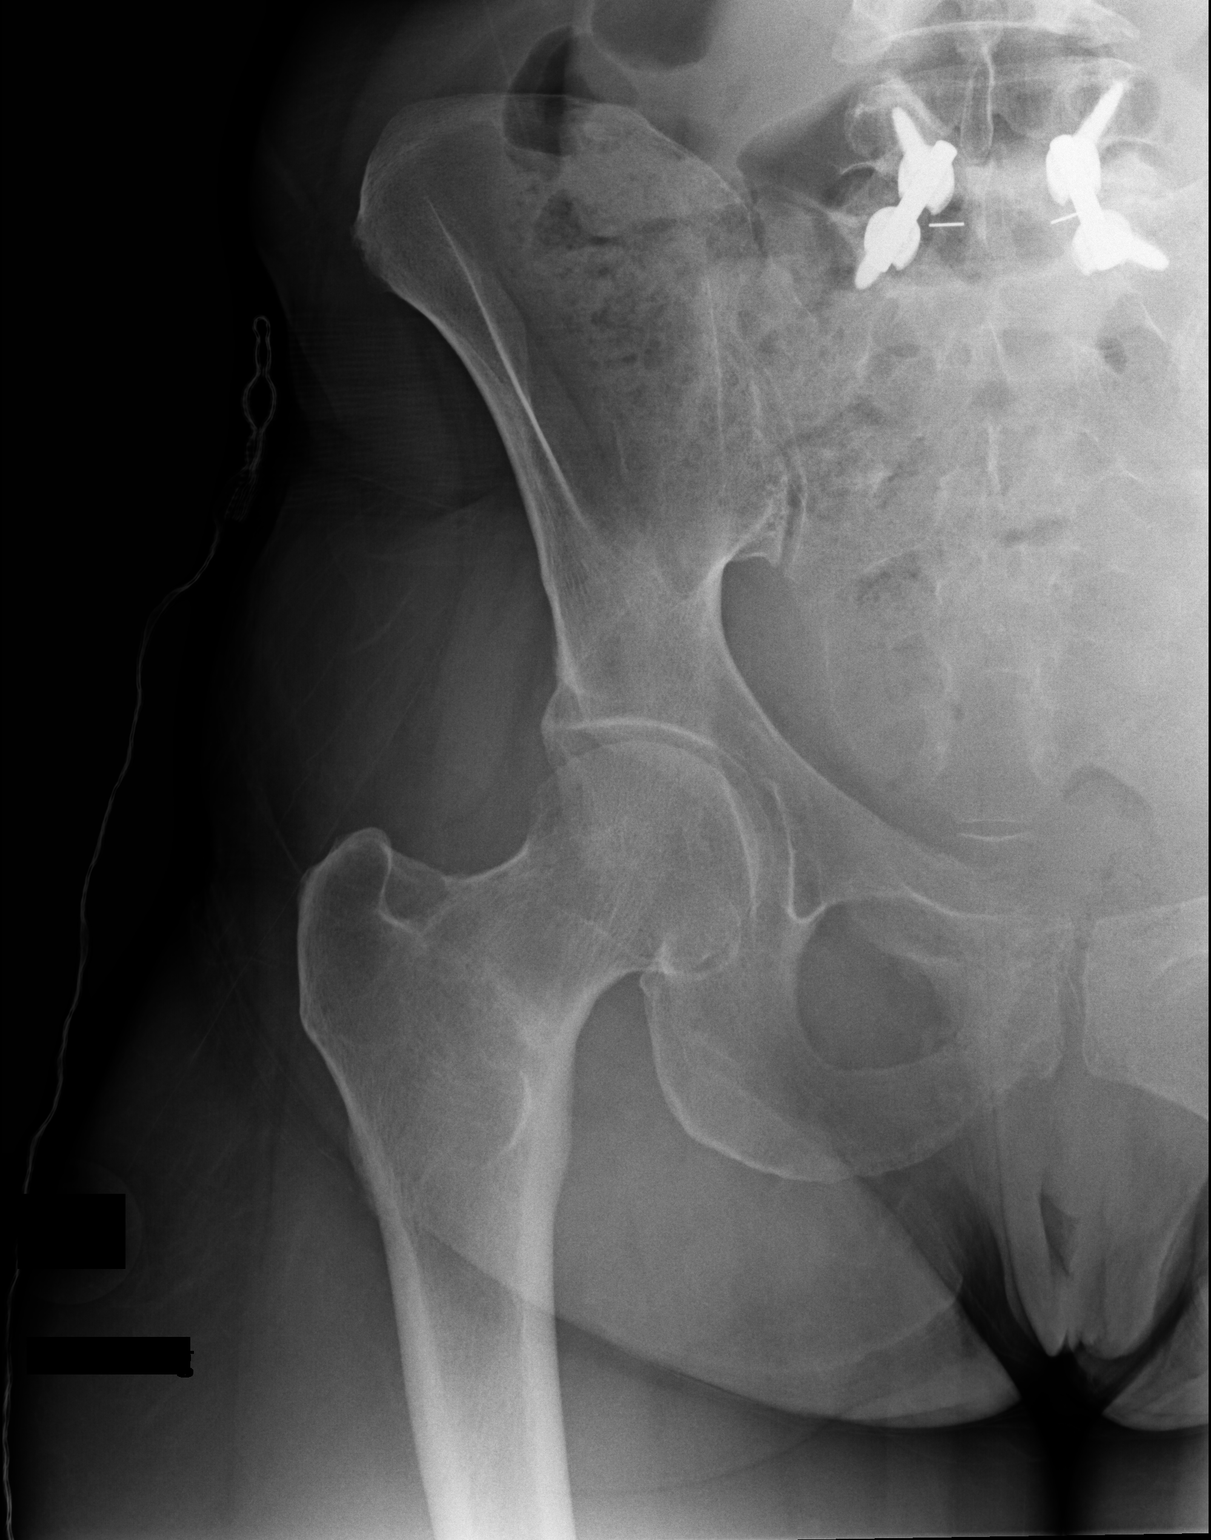

[dg hip unilat w or w/o pelvis 2-3 views  (2 of 2)]
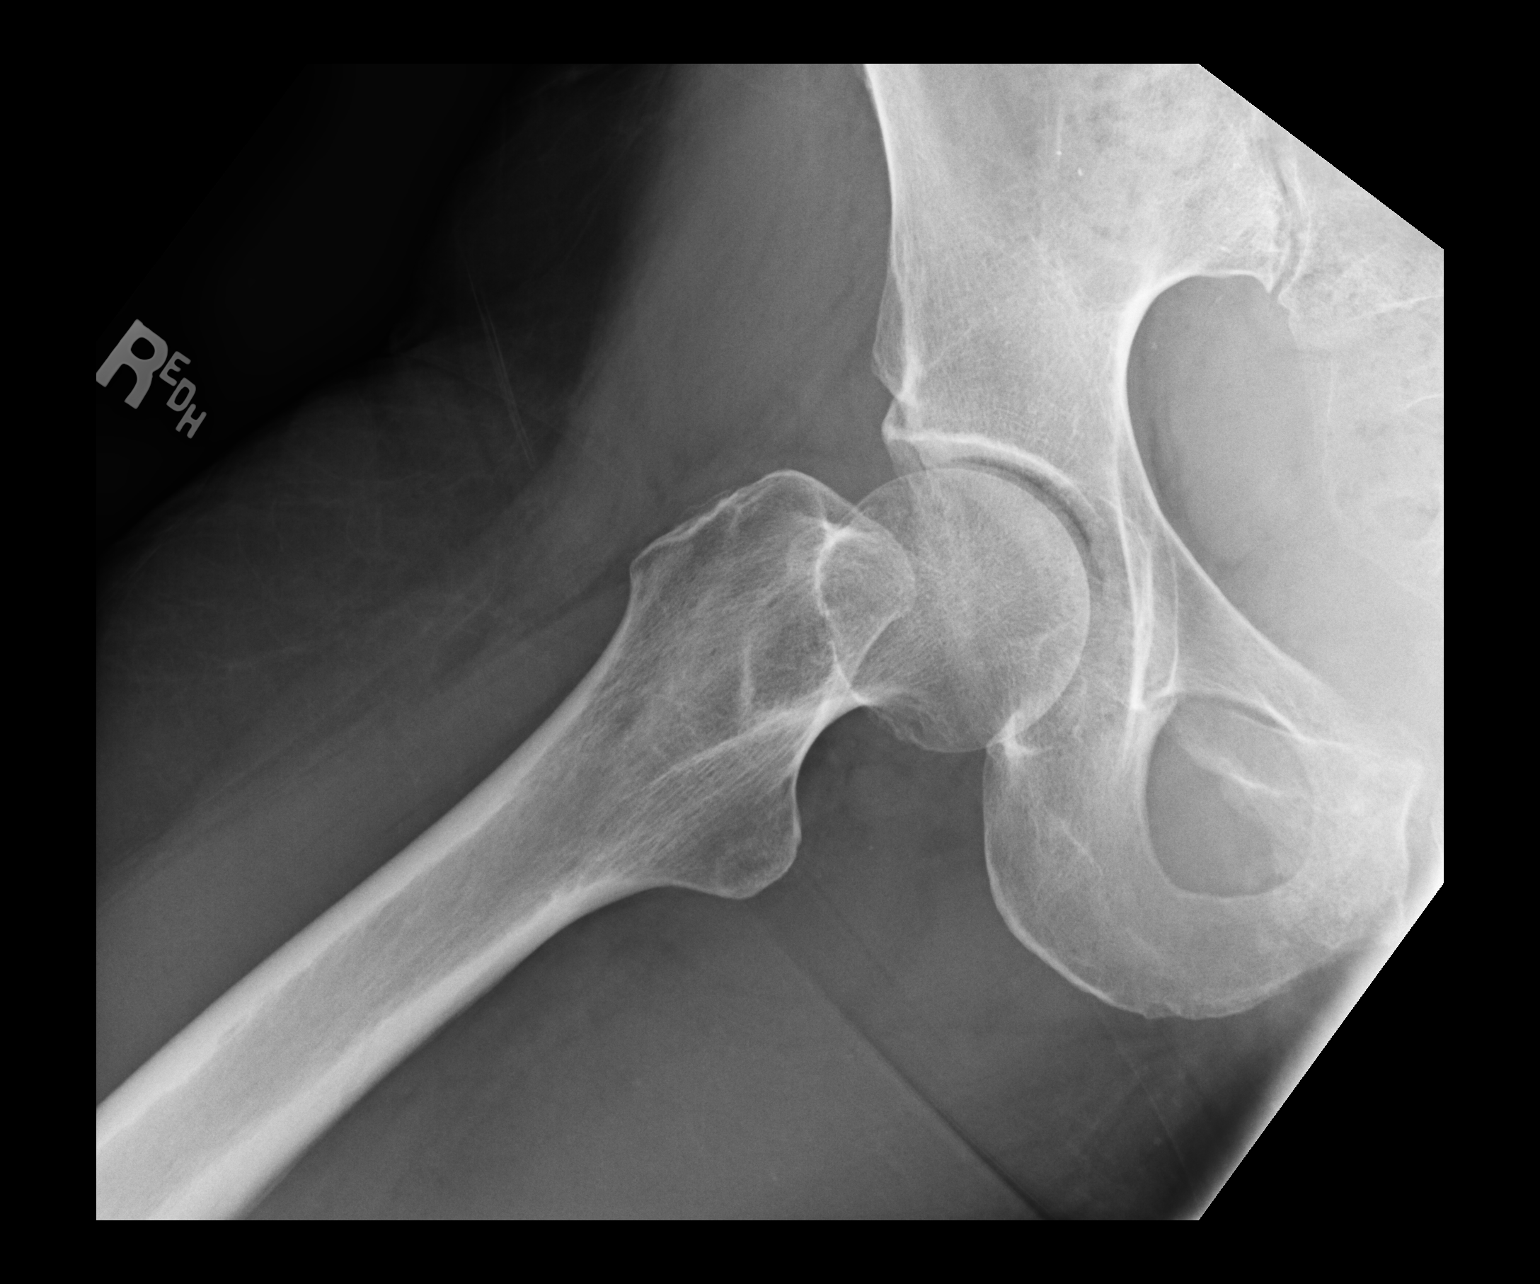

[2 of 2 positions shown; findings below may reference images not displayed]

FINDINGS: No fracture or malalignment of the right hip. Re- demonstrated
fracture involving the right superior pubic ramus and pubic
symphysis with less distinct fracture lucency consistent with
interval healing. No significant change in alignment. Surgical
hardware at the lumbosacral spine.
IMPRESSION: No change in alignment of right superior pubic ramus and pubic bone
fractures with less distinct lucency compatible with interval
healing.

## 2018-11-29 DIAGNOSIS — H2513 Age-related nuclear cataract, bilateral: Secondary | ICD-10-CM | POA: Diagnosis not present

## 2018-11-29 DIAGNOSIS — H25043 Posterior subcapsular polar age-related cataract, bilateral: Secondary | ICD-10-CM | POA: Diagnosis not present

## 2018-11-29 DIAGNOSIS — H25013 Cortical age-related cataract, bilateral: Secondary | ICD-10-CM | POA: Diagnosis not present

## 2018-12-08 ENCOUNTER — Other Ambulatory Visit: Payer: 59

## 2018-12-08 ENCOUNTER — Other Ambulatory Visit: Payer: Self-pay

## 2018-12-08 ENCOUNTER — Ambulatory Visit
Admission: RE | Admit: 2018-12-08 | Discharge: 2018-12-08 | Disposition: A | Discharge status: Home / Self Care | Payer: 59 | Source: Ambulatory Visit | Attending: Obstetrics and Gynecology | Admitting: Obstetrics and Gynecology

## 2018-12-08 DIAGNOSIS — Z1231 Encounter for screening mammogram for malignant neoplasm of breast: Secondary | ICD-10-CM

## 2018-12-12 DIAGNOSIS — H2511 Age-related nuclear cataract, right eye: Secondary | ICD-10-CM | POA: Diagnosis not present

## 2018-12-29 ENCOUNTER — Other Ambulatory Visit: Payer: 59

## 2019-01-11 ENCOUNTER — Ambulatory Visit (INDEPENDENT_AMBULATORY_CARE_PROVIDER_SITE_OTHER): Payer: 59 | Admitting: Family Medicine

## 2019-01-11 ENCOUNTER — Other Ambulatory Visit: Payer: Self-pay

## 2019-01-11 ENCOUNTER — Encounter: Payer: Self-pay | Admitting: Family Medicine

## 2019-01-11 VITALS — BP 134/84 | Ht 66.0 in | Wt 130.0 lb

## 2019-01-11 DIAGNOSIS — M79671 Pain in right foot: Secondary | ICD-10-CM | POA: Diagnosis not present

## 2019-01-11 DIAGNOSIS — R252 Cramp and spasm: Secondary | ICD-10-CM | POA: Diagnosis not present

## 2019-01-11 NOTE — Assessment & Plan Note (Signed)
Pain may be due to inflammation that has yet to resolve from peroneal nerve release surgery three months ago, but etiology is uncertain, although it does appear to be nerve related.  Will have patient send [place] her blood work from her PCP's office and add calcium, magnesium, and CK if they have not been performed recently.  Doubt that anemia is contributing to this issue since she said her hemoglobin was recently checked and was >12.  Also advised patient to try Benadryl to help her sleep at night.  Calcium channel blockers may also be an option for the future if Benadryl is unhelpful.  Hopefully, her symptoms will improve after a few more months due to lessening inflammation from her nerve release surgery.

## 2019-01-11 NOTE — Progress Notes (Addendum)
Subjective:    Donna Ferrell - 58 y.o. female MRN [id number]  Date of birth: [birth year]  CC:  Donna Ferrell is here for right foot pain.  HPI: Patient reports that she has had worsening foot cramps and aching in her bilateral feet, right greater than left.  This problem has been present for several years, she thinks since her L5-S1 fusion surgery, but it seems to be getting worse recently.  She says that she will have cramping on the dorsal side of her right foot that radiates up her lateral leg about 3 or 4 times per night.  She has had to get up and walk around to relieve these cramps.  She will also get cramping in her left foot and leg, but the symptoms are not as severe as those that occur on her right side.  She recently had peroneal nerve release surgery on her right leg on March 10, and she was told to wait for about 6 to 8 months for her nerve function to return to normal.  She has tried tonic water, gabapentin, ropinirole, and B complex vitamins to relieve her cramps, but nothing seems to work very well for her.  She also notes that the toes on her right foot seem to be separating more and spreading farther apart.  She also has this problem on the left foot, but it is worse on the right side.  She is able to walk and hike without pain, however.  Health Maintenance:  There are no preventive care reminders to display for this patient.  -  reports that she has never smoked. She has never used smokeless tobacco. - Review of Systems: Per HPI. - Past Medical History: Patient Active Problem List   Diagnosis Date Noted  . Foot pain, right 01/11/2019  . Fracture of multiple pubic rami, right, closed, initial encounter ([place]) 04/21/2017  . Lumbar stenosis with neurogenic claudication 02/16/2016  . Right knee pain 04/23/2013  . DIZZINESS 02/09/2010  . SCIATICA 02/07/2008  . BURSITIS, SUBTROCHANTERIC 02/07/2008   - Medications: reviewed and updated   Objective:   Physical Exam BP 134/84   Ht 5\' 6"  (1.676 m)   Wt 130 lb (59 kg)   LMP 01/26/2016 (Exact Date) Comment: very irregular   Aug 22 , 2016  BMI 20.98 kg/m  Gen: NAD, alert, cooperative with exam, well-appearing  Foot, right: Inspection:  No obvious bony deformity.  No swelling, erythema, or bruising.  Normal longitudinal arch, collapsing transverse arch. Palpation: No tenderness to palpation ROM: Full ROM of the ankle. Normal midfoot flexibility Strength: 5/5 strength ankle in all planes Neurovascular: N/V intact distally in the lower extremity Special tests: Negative anterior drawer.  Normal calcaneal motion with heel raise  Foot: Inspection:  No obvious bony deformity.  No swelling, erythema, or bruising.  Normal longitudinal arch, some collapse of transverse arch, no hindfoot valgus Palpation: No tenderness to palpation ROM: Full ROM of the ankle. Normal midfoot flexibility Strength: 5/5 strength ankle in all planes Neurovascular: N/V intact distally in the lower extremity Special tests: Negative anterior drawer.  Normal calcaneal motion with heel raise     Assessment & Plan:   Foot pain, right Pain may be due to inflammation that has yet to resolve from peroneal nerve release surgery three months ago, but etiology is uncertain, although it does appear to be nerve related.  Will have patient send [place] her blood work from her PCP's office and add calcium, magnesium, and CK if  they have not been performed recently.  Doubt that anemia is contributing to this issue since she said her hemoglobin was recently checked and was >12.  Also advised patient to try Benadryl to help her sleep at night.  Calcium channel blockers may also be an option for the future if Benadryl is unhelpful.  Hopefully, her symptoms will improve after a few more months due to lessening inflammation from her nerve release surgery.    Lezlie OctaveAmanda Dabney Dever, M.D. 01/11/2019, 11:52 AM PGY-2, [facility]    Addendum:  Spoke with patient after reviewing her recent bloodwork.  No concerning abnormalities or anything to account for her continued cramps.  Will check CK and magnesium.  She had low ferritin over 1.5 years ago, supplemented and it came up to 45 but didn't change her symptoms so will not recheck this.  Will contact her with those results and next steps.  Addendum 7/9:  Spoke with patient with normal labs (Mg only 2.4, symptoms not attributed to this and I'd still consider this normal).  Discussed watchful waiting, consultation back with neurology, trial of calcium channel blocker - she would like to try the latter.  Will start verapamil at low dose 40mg  three times a day - discussed risks of swelling, lowering heart rate - advised to call us in a week to let us know how she's doing.

## 2019-01-11 NOTE — Patient Instructions (Signed)
Send me a copy of your recent bloodwork when you get a chance today via mychart. It's likely I'll recommend some additional tests based on these but I don't want to repeat ones that have already been done. Continue with hydration, stretching, strengthening of your lower legs. Consider low dose benadryl at bedtime to see if this helps with your cramping/sleep. We discussed the tonic water, continuing with B complex and vitamin E which may be beneficial Consider verapamil (calcium channel blocker) if all other measures are not helpful.  You also have metatarsalgia causing the splaying of your toes - if this becomes more of a problem for you we can add a metatarsal pad to inserts to help support this.

## 2019-01-15 ENCOUNTER — Encounter: Payer: Self-pay | Admitting: Family Medicine

## 2019-01-17 NOTE — Addendum Note (Signed)
Addended by: Dene Gentry on: 01/17/2019 01:53 PM   Modules accepted: Orders

## 2019-01-22 ENCOUNTER — Ambulatory Visit: Payer: 59 | Admitting: Obstetrics and Gynecology

## 2019-01-23 ENCOUNTER — Telehealth: Payer: Self-pay | Admitting: Internal Medicine

## 2019-01-23 NOTE — Addendum Note (Signed)
Addended by: Francene Castle on: 01/23/2019 03:14 PM   Modules accepted: Orders

## 2019-01-23 NOTE — Telephone Encounter (Signed)
Patient just wanted to know if she could go to [facility] today to get her blood work done?  Please just let her know, thanks.

## 2019-01-24 LAB — MAGNESIUM: Magnesium: 2.4 mg/dL — ABNORMAL HIGH (ref 1.6–2.3)

## 2019-01-24 LAB — CK: Total CK: 165 U/L (ref 32–182)

## 2019-01-25 MED ORDER — VERAPAMIL HCL 40 MG PO TABS: 40.0000 mg | ORAL_TABLET | Freq: Three times a day (TID) | ORAL | 1 refills | Status: DC

## 2019-01-25 NOTE — Addendum Note (Signed)
Addended by: Dene Gentry on: 01/25/2019 11:49 AM   Modules accepted: Orders

## 2019-01-26 ENCOUNTER — Ambulatory Visit
Admission: RE | Admit: 2019-01-26 | Discharge: 2019-01-26 | Disposition: A | Discharge status: Home / Self Care | Payer: 59 | Source: Ambulatory Visit | Attending: Internal Medicine | Admitting: Internal Medicine

## 2019-01-26 DIAGNOSIS — E785 Hyperlipidemia, unspecified: Secondary | ICD-10-CM

## 2019-02-08 MED ORDER — VERAPAMIL HCL 80 MG PO TABS: 80.0000 mg | ORAL_TABLET | Freq: Three times a day (TID) | ORAL | 1 refills | Status: DC

## 2019-02-08 NOTE — Addendum Note (Signed)
Addended by: Karlton Lemon R on: 02/08/2019 09:00 AM   Modules accepted: Orders

## 2019-03-05 ENCOUNTER — Ambulatory Visit (INDEPENDENT_AMBULATORY_CARE_PROVIDER_SITE_OTHER): Payer: 59 | Admitting: Family Medicine

## 2019-03-05 ENCOUNTER — Other Ambulatory Visit: Payer: Self-pay

## 2019-03-05 ENCOUNTER — Encounter: Payer: Self-pay | Admitting: Family Medicine

## 2019-03-05 VITALS — BP 110/78 | Ht 66.0 in | Wt 134.0 lb

## 2019-03-05 DIAGNOSIS — R252 Cramp and spasm: Secondary | ICD-10-CM

## 2019-03-05 MED ORDER — VERAPAMIL HCL ER 360 MG PO CP24: 360.0000 mg | ORAL_CAPSULE | Freq: Every day | ORAL | 1 refills | Status: DC

## 2019-03-06 ENCOUNTER — Encounter: Payer: Self-pay | Admitting: Family Medicine
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
## 2019-04-04 ENCOUNTER — Other Ambulatory Visit: Payer: Self-pay

## 2019-04-04 ENCOUNTER — Encounter: Payer: Self-pay | Admitting: Obstetrics and Gynecology

## 2019-04-04 ENCOUNTER — Ambulatory Visit (INDEPENDENT_AMBULATORY_CARE_PROVIDER_SITE_OTHER): Payer: 59 | Admitting: Obstetrics and Gynecology

## 2019-04-04 VITALS — BP 110/70 | HR 60 | Temp 96.6°F | Resp 18 | Ht 65.0 in | Wt 134.2 lb

## 2019-04-04 DIAGNOSIS — Z01419 Encounter for gynecological examination (general) (routine) without abnormal findings: Secondary | ICD-10-CM | POA: Diagnosis not present

## 2019-04-04 NOTE — Patient Instructions (Signed)

## 2019-04-05 ENCOUNTER — Telehealth: Payer: Self-pay | Admitting: Obstetrics and Gynecology

## 2019-04-05 NOTE — Telephone Encounter (Signed)
Patient sent the following message through [employer].  Donna Fester "[name]"  P [facility]  Phone Number: [phone]        Hi Dr. Quincy Simmonds,  My PCP, Dr. Brigitte Pulse, is fine with prescribing Trazidone and having me start the Welbutrin again. I can't find any of the Welbutrin at home so he's writing a new script for that too. Thanks again for my appointment today. It was really good to see you. I appreciate your listening skills and support. Take good care in the crazy time in our lives!   Best Regards,  [employer]

## 2019-04-05 NOTE — Telephone Encounter (Signed)
Encounter reviewed and closed.  

## 2019-05-17 ENCOUNTER — Ambulatory Visit: Payer: 59 | Admitting: Sports Medicine

## 2019-05-24 ENCOUNTER — Encounter: Payer: Self-pay | Admitting: Sports Medicine

## 2019-05-24 ENCOUNTER — Ambulatory Visit (INDEPENDENT_AMBULATORY_CARE_PROVIDER_SITE_OTHER): Payer: 59 | Admitting: Sports Medicine

## 2019-05-24 ENCOUNTER — Other Ambulatory Visit: Payer: Self-pay

## 2019-05-24 VITALS — BP 130/86 | Ht 66.0 in | Wt 125.0 lb

## 2019-05-24 DIAGNOSIS — M7711 Lateral epicondylitis, right elbow: Secondary | ICD-10-CM

## 2019-05-24 NOTE — Progress Notes (Signed)
  Donna Ferrell - 58 y.o. female MRN [id number]  Date of birth: 08/25/1960  SUBJECTIVE:   CC: Patient presents with recurrence of lateral elbow pain over the past 2 to 3 months.  Patient was seen in 08/2018 for similar symptoms, was treated for lateral epicondylitis with physical therapy, home exercises, NSAIDs, corticosteroid injection with complete resolution of pain.  Now patient states that pain has recurred over the past 2 to 3 months, worsening over the past 3 weeks. 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  She has attempted to switch hands and to use electric screwdrivers with some benefit. Patient has tried similar treatment modalities to previous occurrence including physical therapy, home exercises, NSAIDs, expired topical NSAID cream without relief.  Pain described as an aching and throbbing over the lateral aspect of right elbow that does not radiate.  Feels "the same" as last time.  Denies numbness/tingling/weakness in extremity.  She has not been using a brace.      ROS: No unexpected weight loss, fever, chills, swelling, instability, muscle pain, numbness/tingling, redness, otherwise see HPI   PMHx - Updated and reviewed.  Contributory factors include: Negative PSHx - Updated and reviewed.  Contributory factors include:  Negative FHx - Updated and reviewed.  Contributory factors include:  Negative Social Hx - Updated and reviewed. Contributory factors include: Negative Medications - reviewed   DATA REVIEWED: N/A  PHYSICAL EXAM:  VS: BP:130/86  HR: bpm  TEMP: ( )  RESP:   HT:5\' 6"  (167.6 cm)   WT:125 lb (56.7 kg)  BMI:20.19 PHYSICAL EXAM: Gen: NAD, alert, cooperative with exam, well-appearing  Musculoskeletal: Upper extremities: Bilateral 5/5 muscle strength.  2+ biceps, brachioradialis, triceps reflexes bilaterally.  Full range of motion of shoulder, elbow, wrist.  Dry skin without lesion, erythema, edema.  No  joint laxity with varus and valgus stressing of elbows bilaterally. Right elbow: Pain with resisted pronation and supination localized to lateral elbow.  Pain with palpation over lateral epicondyle.  Ultrasound revealing increased edema to joint space laterally, no obvious deformities.   ASSESSMENT & PLAN:   #Lateral epicondylitis, right, subsequent encounter -First recurrence after complete resolution with corticosteroid injection, physical therapy, NSAIDs -Chronic and worsening -Patient not a good candidate for repeat corticosteroid injection and risks were discussed with patient who agrees to not pursue additional cortisone injections. -Start compression sleeve when active -Continue Mobic 15 mg daily and lifestyle modifications with decreased "screwdriver" activities with right arm -Continue physical therapy and discuss  iontophoresis on your elbow. I would also continue with the exercises even though they are uncomfortable. If you would like, I can make a referral to Dr.Thekkekandam (Dr T) for a PRP injection.   This note was dictated by Benito Mccreedy, DO, PGY 3.  I agree with the above assessment and plan.  Patient's physical exam findings are consistent with reoccurring lateral epicondylitis.  I recommended against repeat cortisone injections.  I would like for her to try some iontophoresis with Barbaraann Barthel.  Continue with her home exercise program as well.  We will try compression sleeve when active.  I did discuss the possibility of PRP or MRI if symptoms persist.  Patient will follow-up with me as needed.

## 2019-05-24 NOTE — Patient Instructions (Signed)
Great to see you today! When you see Donna Ferrell, ask him to try some iontophoresis on your elbow. I would also continue with the exercises even though they are uncomfortable. If you would like, I can make a referral to Dr.Thekkekandam (Dr T) for a PRP injection.

## 2019-06-04 ENCOUNTER — Other Ambulatory Visit: Payer: Self-pay

## 2019-06-04 MED ORDER — VERAPAMIL HCL 40 MG PO TABS: 40.0000 mg | ORAL_TABLET | Freq: Three times a day (TID) | ORAL | 0 refills | Status: DC | PRN

## 2019-07-02 ENCOUNTER — Other Ambulatory Visit: Payer: Self-pay

## 2019-07-02 DIAGNOSIS — Z20822 Contact with and (suspected) exposure to covid-19: Secondary | ICD-10-CM

## 2019-07-05 ENCOUNTER — Other Ambulatory Visit: Payer: 59

## 2019-07-05 LAB — NOVEL CORONAVIRUS, NAA: SARS-CoV-2, NAA: NOT DETECTED

## 2019-08-29 ENCOUNTER — Other Ambulatory Visit: Payer: Self-pay | Admitting: Family Medicine

## 2019-10-29 ENCOUNTER — Other Ambulatory Visit: Payer: Self-pay | Admitting: Obstetrics and Gynecology

## 2019-10-29 DIAGNOSIS — Z1231 Encounter for screening mammogram for malignant neoplasm of breast: Secondary | ICD-10-CM

## 2019-11-13 ENCOUNTER — Ambulatory Visit (INDEPENDENT_AMBULATORY_CARE_PROVIDER_SITE_OTHER): Payer: 59 | Admitting: Sports Medicine

## 2019-11-13 ENCOUNTER — Encounter: Payer: Self-pay | Admitting: Sports Medicine

## 2019-11-13 ENCOUNTER — Other Ambulatory Visit: Payer: Self-pay

## 2019-11-13 VITALS — BP 144/90 | Ht 65.5 in | Wt 132.0 lb

## 2019-11-13 DIAGNOSIS — R269 Unspecified abnormalities of gait and mobility: Secondary | ICD-10-CM

## 2019-11-13 NOTE — Progress Notes (Signed)
Patient ID: Donna Ferrell, female   DOB: Apr 26, 1961, 59 y.o.   MRN: [id number]  Patient comes in today for custom orthotics.  She has had custom orthotics in the past.  They are quite worn.  She is requesting that we make her 2 new pair today.  Two new pair of custom orthotics were created for her today.  She found them to be comfortable prior to leaving the office.  Gait was neutral with orthotics in place.  Continue with activity as tolerated and follow-up as needed.  Patient was fitted for a : standard, cushioned, semi-rigid orthotic. The orthotic was heated and afterward the patient stood on the orthotic blank positioned on the orthotic stand. The patient was positioned in subtalar neutral position and 10 degrees of ankle dorsiflexion in a weight bearing stance. After completion of molding, a stable base was applied to the orthotic blank. The blank was ground to a stable position for weight bearing. Size: 9 Base: Blue EVA Posting: B/L first ray posts Additional orthotic padding: B/L poron heel lift; B/L MT pads  Patient was fitted for a : standard, cushioned, semi-rigid orthotic. The orthotic was heated and afterward the patient stood on the orthotic blank positioned on the orthotic stand. The patient was positioned in subtalar neutral position and 10 degrees of ankle dorsiflexion in a weight bearing stance. After completion of molding, a stable base was applied to the orthotic blank. The blank was ground to a stable position for weight bearing. Size: 9 Base: Blue EVA Posting: B/L first ray posts Additional orthotic padding: B/L poron heel lifts; B/L MT pads

## 2019-12-10 ENCOUNTER — Other Ambulatory Visit: Payer: Self-pay

## 2019-12-10 ENCOUNTER — Ambulatory Visit
Admission: RE | Admit: 2019-12-10 | Discharge: 2019-12-10 | Disposition: A | Discharge status: Home / Self Care | Payer: 59 | Source: Ambulatory Visit | Attending: Obstetrics and Gynecology | Admitting: Obstetrics and Gynecology

## 2019-12-10 DIAGNOSIS — Z1231 Encounter for screening mammogram for malignant neoplasm of breast: Secondary | ICD-10-CM

## 2019-12-11 ENCOUNTER — Other Ambulatory Visit: Payer: Self-pay | Admitting: Neurological Surgery

## 2019-12-11 DIAGNOSIS — M21371 Foot drop, right foot: Secondary | ICD-10-CM

## 2019-12-25 DIAGNOSIS — N951 Menopausal and female climacteric states: Secondary | ICD-10-CM | POA: Insufficient documentation

## 2020-01-07 ENCOUNTER — Ambulatory Visit
Admission: RE | Admit: 2020-01-07 | Discharge: 2020-01-07 | Disposition: A | Discharge status: Home / Self Care | Payer: 59 | Source: Ambulatory Visit | Attending: Neurological Surgery | Admitting: Neurological Surgery

## 2020-01-07 DIAGNOSIS — M21371 Foot drop, right foot: Secondary | ICD-10-CM

## 2020-01-07 MED ORDER — GADOBENATE DIMEGLUMINE 529 MG/ML IV SOLN
12.0000 mL | Freq: Once | INTRAVENOUS | Status: AC | PRN
  Administered 2020-01-07: 12 mL via INTRAVENOUS

## 2020-04-07 ENCOUNTER — Encounter: Payer: Self-pay | Admitting: Obstetrics and Gynecology

## 2020-04-07 ENCOUNTER — Ambulatory Visit: Payer: 59 | Admitting: Obstetrics and Gynecology

## 2020-04-07 ENCOUNTER — Other Ambulatory Visit: Payer: Self-pay

## 2020-04-07 VITALS — BP 140/72 | HR 56 | Resp 16 | Ht 65.0 in | Wt 140.0 lb

## 2020-04-07 DIAGNOSIS — Z01419 Encounter for gynecological examination (general) (routine) without abnormal findings: Secondary | ICD-10-CM | POA: Diagnosis not present
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
## 2020-04-07 NOTE — Patient Instructions (Signed)

## 2020-04-14 ENCOUNTER — Other Ambulatory Visit: Payer: Self-pay

## 2020-04-14 ENCOUNTER — Telehealth: Payer: Self-pay | Admitting: Obstetrics and Gynecology

## 2020-04-14 ENCOUNTER — Ambulatory Visit: Payer: 59 | Admitting: Cardiology

## 2020-04-14 ENCOUNTER — Encounter: Payer: Self-pay | Admitting: Cardiology

## 2020-04-14 VITALS — BP 120/60 | HR 51 | Temp 94.8°F | Ht 65.0 in | Wt 144.6 lb

## 2020-04-14 DIAGNOSIS — Z7189 Other specified counseling: Secondary | ICD-10-CM | POA: Diagnosis not present

## 2020-04-14 DIAGNOSIS — I2510 Atherosclerotic heart disease of native coronary artery without angina pectoris: Secondary | ICD-10-CM

## 2020-04-14 DIAGNOSIS — R931 Abnormal findings on diagnostic imaging of heart and coronary circulation: Secondary | ICD-10-CM | POA: Diagnosis not present
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
## 2020-04-14 NOTE — Telephone Encounter (Signed)
Please send a My Chart communication to my patient with the following information:  The practitioner I would recommend she see is Dr. Norberto Sorenson, [address] in [place].  The phone number for the office is [phone].

## 2020-04-14 NOTE — Patient Instructions (Addendum)
Medication Instructions:  Restart rosuvastatin 10 mg every other day. If muscle pain worsens, stop statin and send me a message.  *If you need a refill on your cardiac medications before your next appointment, please call your pharmacy*   Lab Work: None ordered  Testing/Procedures: None ordered    Follow-Up: At [facility], you and your health needs are our priority.  As part of our continuing mission to provide you with exceptional heart care, we have created designated Provider Care Teams.  These Care Teams include your primary Cardiologist (physician) and Advanced Practice Providers (APPs -  Physician Assistants and Nurse Practitioners) who all work together to provide you with the care you need, when you need it.  We recommend signing up for the patient portal called "MyChart".  Sign up information is provided on this After Visit Summary.  MyChart is used to connect with patients for Virtual Visits (Telemedicine).  Patients are able to view lab/test results, encounter notes, upcoming appointments, etc.  Non-urgent messages can be sent to your provider as well.   To learn more about what you can do with MyChart, go to [email or web address].    Your next appointment:   6 month(s)  The format for your next appointment:   In Person  Provider:   Jodelle Red, MD

## 2020-04-14 NOTE — Telephone Encounter (Signed)
MyChart message to patient.  ° °Encounter closed.  °

## 2020-05-13 ENCOUNTER — Ambulatory Visit: Payer: 59 | Admitting: Sports Medicine

## 2020-05-13 ENCOUNTER — Other Ambulatory Visit: Payer: Self-pay

## 2020-05-13 VITALS — BP 120/78 | Ht 65.0 in | Wt 135.0 lb

## 2020-05-13 DIAGNOSIS — M7711 Lateral epicondylitis, right elbow: Secondary | ICD-10-CM

## 2020-05-13 NOTE — Progress Notes (Signed)
Donna Ferrell is a 59 y.o. female who presents to [facility] today for the following:  Right Elbow Pain Last seen 05/24/2019 for the same Overall, has been dealing with pain in her right elbow since February 2020 She has been doing PT, iontophoresis, scraping Using a compression sleeve She works with her hands and uses a screwdriver frequently, she has been trying to do this with her left arm more because it aggravates the pain in her right arm Overall, she continues to have pain Reports that the pain is a soreness, has acute severe pain less than before, but soreness is constant Has also noticed over the last few weeks that she has been having some pain in the right side of her neck/upper back  PMH reviewed.  ROS as above. Medications reviewed.  Exam:  BP 120/78   Ht 5\' 5"  (1.651 m)   Wt 135 lb (61.2 kg)   LMP 01/26/2016 (Exact Date) Comment: very irregular   Aug 22 , 2016  BMI 22.47 kg/m  Gen: Well NAD MSK:  Right elbow: - Inspection: no obvious deformity. No swelling, erythema or bruising - Palpation: Tenderness to palpation directly over lateral epicondyle on right, mild tenderness palpation of right radial head - ROM: full active ROM in flexion and extension b/l. - Strength: 5/5 strength in wrist flexion and extension b/l, pain with resisted extension on right. 5/5 strength in biceps, triceps b/l. - Neuro: NV intact distally - Special testing: no laxity with varus/valgus stress. Pain with resisted ECRB or supination on right.   Right Shoulder: Inspection reveals no obvious deformity, atrophy, or asymmetry. No bruising. No swelling Palpation is normal with no TTP over [facility] joint or bicipital groove. Full ROM in flexion, abduction, internal/external rotation b/l NV intact distally b/l Special Tests:  - Impingement: Neg Hawkins, neers, empty can sign. - Supraspinatous: Negative empty can - Infraspinatous/Teres Minor: 5/5 strength with ER - Subscapularis: 5/5 strength  with IR - Biceps tendon: Negative Speeds - Labrum: Negative Obriens - AC Joint: Negative cross arm - No painful arc and no drop arm sign  Neck: - Inspection: no gross deformity or asymmetry, swelling or ecchymosis - Palpation: No TTP spinous process, TTP, TTP right rhomboid and right trapezius with pressure point at mid scapula level - ROM: full active ROM of the cervical spine with neck extension, rotation, flexion, no pain with ROM - Strength: Per above, OK sign, interosseus strength intact  - Neuro: sensation intact in the C5-C8 nerve root distribution b/l - Special testing: negative spurling's   No results found.   Assessment and Plan: 1) Lateral epicondylitis of right elbow Continues to be symptomatic, only improved slightly.  Given that she continues to have pain and has done extensive physical therapy and conservative measures including a corticosteroid injection, will refer her to [facility] for further evaluation for possible surgery.  Discussed with her that he may obtain MRI for further imaging, but it would be helpful to hear from an upper extremity specialist to determine further course of action since she has been having pain for so long.  For now, can continue with PT and conservative measures.  Discussed with her that her pain is most likely compensatory secondary to avoiding the use of her right elbow due to pain.  She does not have any symptoms of shoulder pathology or neck pathology.  She will follow-up after meeting with Dr. Everardo Pacific if she does not improve.   Everardo Pacific, D.O.  PGY-3 Family Medicine  05/13/2020 10:08 AM   Patient seen and evaluated with the resident.  I agree with the above plan of care.  Patient has chronic lateral epicondylitis which has been recalcitrant to long term conservative treatment.  Patient would be interested in discussing surgical debridement.  Therefore, I will refer her to Dr. Everardo Pacific to discuss this further.  Follow-up with me as  needed.

## 2020-05-13 NOTE — Assessment & Plan Note (Signed)
Continues to be symptomatic, only improved slightly.  Given that she continues to have pain and has done extensive physical therapy and conservative measures including a corticosteroid injection, will refer her to Dr. Everardo Pacific for further evaluation for possible surgery.  Discussed with her that he may obtain MRI for further imaging, but it would be helpful to hear from an upper extremity specialist to determine further course of action since she has been having pain for so long.  For now, can continue with PT and conservative measures.  Discussed with her that her pain is most likely compensatory secondary to avoiding the use of her right elbow due to pain.  She does not have any symptoms of shoulder pathology or neck pathology.  She will follow-up after meeting with Dr. Everardo Pacific if she does not improve.

## 2020-05-13 NOTE — Patient Instructions (Addendum)
We will send your to see Dr. Everardo Pacific at [facility] to discuss your options.  [address] [place], [place] [phone]  Appt: 05/20/20 @ 11:15 am.  Bonita Quin can continue what you have been doing up to this point including PT and compression sleeve.  Your neck pain is compensatory from your elbow pain.    Come back to see [place] after you see Dr. Everardo Pacific if you don't have improvement.

## 2020-08-07 ENCOUNTER — Telehealth: Payer: Self-pay | Admitting: Neurology

## 2020-08-07 NOTE — Telephone Encounter (Signed)
Delbert Harness (Tammy) need to order a EMG for the Pt. Would like a call from the nurse. Contact info: [phone]

## 2020-08-07 NOTE — Telephone Encounter (Signed)
I returned Donna Ferrell's call however I was unable to reach anyone. Will try to call back on Monday.

## 2020-08-11 NOTE — Telephone Encounter (Signed)
I spoke with Donna Ferrell. She stated a referral had been sent. I checked and status and it is currently under review.

## 2020-08-28 ENCOUNTER — Encounter: Payer: 59 | Admitting: Neurology

## 2020-09-08 ENCOUNTER — Encounter: Payer: 59 | Admitting: Neurology

## 2020-09-11 ENCOUNTER — Ambulatory Visit (INDEPENDENT_AMBULATORY_CARE_PROVIDER_SITE_OTHER): Payer: 59 | Admitting: Neurology

## 2020-09-11 DIAGNOSIS — G5731 Lesion of lateral popliteal nerve, right lower limb: Secondary | ICD-10-CM | POA: Diagnosis not present

## 2020-09-11 DIAGNOSIS — Z0289 Encounter for other administrative examinations: Secondary | ICD-10-CM

## 2020-09-11 DIAGNOSIS — R200 Anesthesia of skin: Secondary | ICD-10-CM

## 2020-09-11 NOTE — Progress Notes (Signed)
See procedure note.

## 2020-09-11 NOTE — Progress Notes (Signed)
Full Name: Al Gagen Gender: Female MRN #: [id number] Date of Birth: [birth year]    Visit Date: 09/11/2020 08:26 Age: 60 Years Examining Physician: Naomie Dean, MD  Requesting Physician: Ramond Marrow, MD    History: History of peroneal neuropathy s/p transposition Summary: NCS performed on the bilateral lower extremities. EMG performed on the right lower extremity. The right Peroneal sensory nerve showed no response. The right Peroneal motor nerve was within normal limits (2.85mV) and showed improvement from last studies completed in 2019(1.70mV). All remaining nerves (as indicated in the following tables) were within normal limits.  The right tibialis anterior muscle showed prolonged motor unit duration, increased motor unit amplitude, polyphasic motor units and diminished motor unit recruitment, the right peroneus longus showed prolonged motor unit duration, polyphasic motor units and diminished motor unit recruitment.  All remaining muscles (as indicated in the following tables) were within normal limits.     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 The right peroneal motor conduction amplitude is within normal limits and improved since last study(see details in summary above). The right peroneal sensory nerve showed no response(similar to prior studies). EMG showed reinnervation in peroneal muscles.   Naomie Dean, M.D.  [facility] [address] [place], [place] [address] Tel: [phone] Fax: [phone]  Verbal informed consent was obtained from the patient, patient was informed of potential risk of procedure, including bruising, bleeding, hematoma formation, infection, muscle weakness, muscle pain, numbness, among others.        MNC    Nerve / Sites Muscle Latency Ref. Amplitude Ref. Rel Amp Segments Distance Velocity Ref. Area    ms ms mV mV %  cm m/s m/s mVms  R Peroneal - EDB     Ankle EDB 6.3 ?6.5 2.4 ?2.0 100  Ankle - EDB 9   10.9     Fib head EDB 12.0  2.1  87.3 Fib head - Ankle 25 44 ?44 9.0     Pop fossa EDB 14.3  2.2  109 Pop fossa - Fib head 10 44 ?44 12.0         Pop fossa - Ankle      L Peroneal - EDB     Ankle EDB 4.9 ?6.5 2.8 ?2.0 100 Ankle - EDB 9   14.9     Fib head EDB 10.6  2.6  93.7 Fib head - Ankle 25 44 ?44 15.2     Pop fossa EDB 12.9  2.6  102 Pop fossa - Fib head 10 44 ?44 15.6         Pop fossa - Ankle      R Tibial - AH     Ankle AH 4.3 ?5.8 5.2 ?4.0 100 Ankle - AH 9   10.7     Pop fossa AH 13.4  3.2  61.8 Pop fossa - Ankle 38 42 ?41 10.9                SNC    Nerve / Sites Rec. Site Peak Lat Ref.  Amp Ref. Segments Distance    ms ms V V  cm  R Sural - Ankle (Calf)     Calf Ankle 3.3 ?4.4 10 ?6 Calf - Ankle 14  R Superficial peroneal - Ankle     Lat leg Ankle NR ?4.4 NR ?6 Lat leg - Ankle 14         F  Wave    Nerve F Lat  Ref.   ms ms  R Tibial - AH 54.5 ?56.0       EMG Summary Table    Spontaneous MUAP Recruitment  Muscle IA Fib PSW Fasc Other Amp Dur. Poly Pattern  R. Vastus medialis Normal None None None _______ Normal Normal Normal Normal  R. Tibialis anterior Normal None None None _______ Increased Increased 3+ Reduced  R. Peroneus longus Normal None None None _______ Normal Increased 3+ Reduced  R. Gastrocnemius (Medial head) Normal None None None _______ Normal Normal Normal Normal  R. Extensor hallucis longus Normal None None None _______ Normal Normal Normal Normal

## 2020-09-13 NOTE — Procedures (Signed)
Full Name: Donna Ferrell Gender: Female MRN #: [id number] Date of Birth: [birth year]    Visit Date: 09/11/2020 08:26 Age: 60 Years Examining Physician: Naomie Dean, MD  Requesting Physician: Ramond Marrow, MD    History: History of peroneal neuropathy s/p transposition Summary: NCS performed on the bilateral lower extremities. EMG performed on the right lower extremity. The right Peroneal sensory nerve showed no response. The right Peroneal motor nerve was within normal limits (2.85mV) and showed improvement from last studies completed in 2019(1.70mV). All remaining nerves (as indicated in the following tables) were within normal limits.  The right tibialis anterior muscle showed prolonged motor unit duration, increased motor unit amplitude, polyphasic motor units and diminished motor unit recruitment, the right peroneus longus showed prolonged motor unit duration, polyphasic motor units and diminished motor unit recruitment.  All remaining muscles (as indicated in the following tables) were within normal limits.     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 The right peroneal motor conduction amplitude is within normal limits and improved since last study(see details in summary above). The right peroneal sensory nerve showed no response(similar to prior studies). EMG showed reinnervation in peroneal muscles.   Naomie Dean, M.D.  [facility] [address] [place], [place] [address] Tel: [phone] Fax: [phone]  Verbal informed consent was obtained from the patient, patient was informed of potential risk of procedure, including bruising, bleeding, hematoma formation, infection, muscle weakness, muscle pain, numbness, among others.        MNC    Nerve / Sites Muscle Latency Ref. Amplitude Ref. Rel Amp Segments Distance Velocity Ref. Area    ms ms mV mV %  cm m/s m/s mVms  R Peroneal - EDB     Ankle EDB 6.3 ?6.5 2.4 ?2.0 100  Ankle - EDB 9   10.9     Fib head EDB 12.0  2.1  87.3 Fib head - Ankle 25 44 ?44 9.0     Pop fossa EDB 14.3  2.2  109 Pop fossa - Fib head 10 44 ?44 12.0         Pop fossa - Ankle      L Peroneal - EDB     Ankle EDB 4.9 ?6.5 2.8 ?2.0 100 Ankle - EDB 9   14.9     Fib head EDB 10.6  2.6  93.7 Fib head - Ankle 25 44 ?44 15.2     Pop fossa EDB 12.9  2.6  102 Pop fossa - Fib head 10 44 ?44 15.6         Pop fossa - Ankle      R Tibial - AH     Ankle AH 4.3 ?5.8 5.2 ?4.0 100 Ankle - AH 9   10.7     Pop fossa AH 13.4  3.2  61.8 Pop fossa - Ankle 38 42 ?41 10.9                SNC    Nerve / Sites Rec. Site Peak Lat Ref.  Amp Ref. Segments Distance    ms ms V V  cm  R Sural - Ankle (Calf)     Calf Ankle 3.3 ?4.4 10 ?6 Calf - Ankle 14  R Superficial peroneal - Ankle     Lat leg Ankle NR ?4.4 NR ?6 Lat leg - Ankle 14         F  Wave    Nerve F Lat  Ref.   ms ms  R Tibial - AH 54.5 ?56.0       EMG Summary Table    Spontaneous MUAP Recruitment  Muscle IA Fib PSW Fasc Other Amp Dur. Poly Pattern  R. Vastus medialis Normal None None None _______ Normal Normal Normal Normal  R. Tibialis anterior Normal None None None _______ Increased Increased 3+ Reduced  R. Peroneus longus Normal None None None _______ Normal Increased 3+ Reduced  R. Gastrocnemius (Medial head) Normal None None None _______ Normal Normal Normal Normal  R. Extensor hallucis longus Normal None None None _______ Normal Normal Normal Normal      

## 2020-10-03 ENCOUNTER — Ambulatory Visit (INDEPENDENT_AMBULATORY_CARE_PROVIDER_SITE_OTHER): Payer: 59 | Admitting: Cardiology

## 2020-10-03 ENCOUNTER — Other Ambulatory Visit: Payer: Self-pay

## 2020-10-03 ENCOUNTER — Encounter: Payer: Self-pay | Admitting: Cardiology

## 2020-10-03 VITALS — BP 144/94 | HR 51 | Ht 65.0 in | Wt 147.0 lb

## 2020-10-03 DIAGNOSIS — R931 Abnormal findings on diagnostic imaging of heart and coronary circulation: Secondary | ICD-10-CM | POA: Diagnosis not present

## 2020-10-03 DIAGNOSIS — I2510 Atherosclerotic heart disease of native coronary artery without angina pectoris: Secondary | ICD-10-CM

## 2020-10-03 DIAGNOSIS — E7800 Pure hypercholesterolemia, unspecified: Secondary | ICD-10-CM

## 2020-10-03 DIAGNOSIS — Z7189 Other specified counseling: Secondary | ICD-10-CM

## 2020-10-03 NOTE — Patient Instructions (Addendum)
Medication Instructions:  Your Physician recommend you continue on your current medication as directed.    *If you need a refill on your cardiac medications before your next appointment, please call your pharmacy*   Lab Work: None   Testing/Procedures: None   Follow-Up: At [facility], you and your health needs are our priority.  As part of our continuing mission to provide you with exceptional heart care, we have created designated Provider Care Teams.  These Care Teams include your primary Cardiologist (physician) and Advanced Practice Providers (APPs -  Physician Assistants and Nurse Practitioners) who all work together to provide you with the care you need, when you need it.  We recommend signing up for the patient portal called "MyChart".  Sign up information is provided on this After Visit Summary.  MyChart is used to connect with patients for Virtual Visits (Telemedicine).  Patients are able to view lab/test results, encounter notes, upcoming appointments, etc.  Non-urgent messages can be sent to your provider as well.   To learn more about what you can do with MyChart, go to [email or web address].    Your next appointment:   6 month(s) @ [address] [place], [place] [address]  The format for your next appointment:   In Person  Provider:   Jodelle Red, MD  -how to check blood pressure:  -sit comfortably in a chair, feet uncrossed and flat on floor, for 5-10 minutes  -arm ideally should rest at the level of the heart. However, arm should be relaxed and not tense (for example, do not hold the arm up unsupported)  -avoid exercise, caffeine, and tobacco for at least 30 minutes prior to BP reading  -don't take BP cuff reading over clothes (always place on skin directly)
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
## 2020-10-06 ENCOUNTER — Encounter (INDEPENDENT_AMBULATORY_CARE_PROVIDER_SITE_OTHER): Payer: Self-pay

## 2020-11-03 ENCOUNTER — Other Ambulatory Visit: Payer: Self-pay | Admitting: Obstetrics and Gynecology

## 2020-11-03 DIAGNOSIS — Z1231 Encounter for screening mammogram for malignant neoplasm of breast: Secondary | ICD-10-CM

## 2020-12-22 DIAGNOSIS — Z1231 Encounter for screening mammogram for malignant neoplasm of breast: Secondary | ICD-10-CM

## 2021-02-11 ENCOUNTER — Ambulatory Visit
Admission: RE | Admit: 2021-02-11 | Discharge: 2021-02-11 | Disposition: A | Discharge status: Home / Self Care | Payer: 59 | Source: Ambulatory Visit | Attending: Obstetrics and Gynecology | Admitting: Obstetrics and Gynecology

## 2021-02-11 ENCOUNTER — Other Ambulatory Visit: Payer: Self-pay

## 2021-02-11 DIAGNOSIS — Z1231 Encounter for screening mammogram for malignant neoplasm of breast: Secondary | ICD-10-CM
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
## 2021-04-03 ENCOUNTER — Encounter (HOSPITAL_BASED_OUTPATIENT_CLINIC_OR_DEPARTMENT_OTHER): Payer: Self-pay | Admitting: Cardiology

## 2021-04-03 ENCOUNTER — Ambulatory Visit (INDEPENDENT_AMBULATORY_CARE_PROVIDER_SITE_OTHER): Payer: 59 | Admitting: Cardiology

## 2021-04-03 ENCOUNTER — Other Ambulatory Visit: Payer: Self-pay

## 2021-04-03 VITALS — BP 120/78 | HR 55 | Ht 65.0 in | Wt 148.0 lb

## 2021-04-03 DIAGNOSIS — I2510 Atherosclerotic heart disease of native coronary artery without angina pectoris: Secondary | ICD-10-CM

## 2021-04-03 DIAGNOSIS — Z7189 Other specified counseling: Secondary | ICD-10-CM

## 2021-04-03 DIAGNOSIS — E7800 Pure hypercholesterolemia, unspecified: Secondary | ICD-10-CM

## 2021-04-03 DIAGNOSIS — R931 Abnormal findings on diagnostic imaging of heart and coronary circulation: Secondary | ICD-10-CM | POA: Insufficient documentation

## 2021-04-03 NOTE — Assessment & Plan Note (Signed)
Goal LDL <70 with elevated Calcium score. Changed from rosuvastatin to atorvastatin in April 2022 after labs returned, not at goal, had myalgias on rosuvastatin. Rechecked lipids 02/20/21: Tchol 137, TG 107, HDL 51, LDL 65. Continue atorvastatin 20 mg/day. Some unilateral muscle aches, thinks it is more related to nerve issues.

## 2021-04-03 NOTE — Patient Instructions (Signed)
Medication Instructions:  Continue current medications  *If you need a refill on your cardiac medications before your next appointment, please call your pharmacy*   Lab Work: None Ordered   Testing/Procedures: None Ordered   Follow-Up: At [facility], you and your health needs are our priority.  As part of our continuing mission to provide you with exceptional heart care, we have created designated Provider Care Teams.  These Care Teams include your primary Cardiologist (physician) and Advanced Practice Providers (APPs -  Physician Assistants and Nurse Practitioners) who all work together to provide you with the care you need, when you need it.  We recommend signing up for the patient portal called "MyChart".  Sign up information is provided on this After Visit Summary.  MyChart is used to connect with patients for Virtual Visits (Telemedicine).  Patients are able to view lab/test results, encounter notes, upcoming appointments, etc.  Non-urgent messages can be sent to your provider as well.   To learn more about what you can do with MyChart, go to [email or web address].    Your next appointment:   1 year(s)  The format for your next appointment:   In Person  Provider:   Bridgette Christopher, MD     

## 2021-04-03 NOTE — Assessment & Plan Note (Signed)
Discussed aspirin and statin today. She is tolerating atorvastatin. Discussed guidelines, recommendations for aspirin. She thinks meloxicam already thins her blood. Discussed NSAIDs, but she is much more functional on meloxicam. Ok to not be on aspirin per patient preference. Did review red flag warning signs that need medical attention.  Has started testosterone, being monitored by Dr. Norberto Sorenson. Discussed data on risk of MI with testosterone. She will monitor for symptoms.

## 2021-04-13 ENCOUNTER — Ambulatory Visit (INDEPENDENT_AMBULATORY_CARE_PROVIDER_SITE_OTHER): Payer: 59 | Admitting: Obstetrics and Gynecology

## 2021-04-13 ENCOUNTER — Encounter: Payer: Self-pay | Admitting: Obstetrics and Gynecology

## 2021-04-13 ENCOUNTER — Other Ambulatory Visit: Payer: Self-pay

## 2021-04-13 VITALS — BP 122/76 | HR 72 | Ht 64.5 in | Wt 147.0 lb

## 2021-04-13 DIAGNOSIS — Z01419 Encounter for gynecological examination (general) (routine) without abnormal findings: Secondary | ICD-10-CM

## 2021-04-13 NOTE — Patient Instructions (Signed)
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
## 2022-01-12 ENCOUNTER — Other Ambulatory Visit: Payer: Self-pay | Admitting: Obstetrics and Gynecology

## 2022-01-12 DIAGNOSIS — Z1231 Encounter for screening mammogram for malignant neoplasm of breast: Secondary | ICD-10-CM

## 2022-02-12 ENCOUNTER — Other Ambulatory Visit: Payer: Self-pay | Admitting: Neurological Surgery

## 2022-02-12 DIAGNOSIS — Z1231 Encounter for screening mammogram for malignant neoplasm of breast: Secondary | ICD-10-CM

## 2022-02-19 ENCOUNTER — Other Ambulatory Visit: Payer: Self-pay | Admitting: Neurological Surgery

## 2022-02-19 ENCOUNTER — Ambulatory Visit
Admission: RE | Admit: 2022-02-19 | Discharge: 2022-02-19 | Disposition: A | Discharge status: Home / Self Care | Payer: 59 | Source: Ambulatory Visit | Attending: Obstetrics and Gynecology | Admitting: Obstetrics and Gynecology

## 2022-02-19 DIAGNOSIS — Z1231 Encounter for screening mammogram for malignant neoplasm of breast: Secondary | ICD-10-CM

## 2022-03-18 ENCOUNTER — Ambulatory Visit: Payer: 59 | Admitting: Sports Medicine

## 2022-03-18 VITALS — BP 122/80 | Ht 65.0 in | Wt 130.0 lb

## 2022-03-18 DIAGNOSIS — G2581 Restless legs syndrome: Secondary | ICD-10-CM | POA: Diagnosis not present

## 2022-03-18 NOTE — Progress Notes (Signed)
PCP: Cleatis Polka., MD  Subjective:   HPI: Patient is a 61 y.o. adult here for bilateral leg/muscle cramps.  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 Has tried multiple treatments for these cramps including tonic water, gabapentin, verapamil, ropinirole, B vitamins without much relief, unfortunately. A sleep study a few years ago did not have any relevant findings to explain the symptoms.  Today, the cramping does not feel muscular and is very specific in location to a band on the lateral calf up to the knee, as well as the dorsal surfaces of both feet. Walking around at night helps. Awakens 2-6 times per night, which is greatly affecting sleep quality. Fortunately, still able to bike ~100x per week without difficulty or cramping. Stays well-hydrated and drinks electrolytes.  Currently scheduled for back surgery with Dr. Danielle Dess. S/P L5-S1 fusion previously with plan for L2-S1 fusion.       Objective:  Physical Exam:  Gen: NAD, comfortable in exam room  Right lower leg: Well-healed scar from peroneal nerve surgery. No bruising, deformity,swelling. Good strength in knee and ankle 5/5. FROM, sensation intact. No tenderness to palpation.  Left lower leg: No bruising, deformity, swelling. Good strength in knee and ankle 5/5, FROM, sensation intact. No tenderness to palpation.   Assessment & Plan:  1. Muscle cramps in bilateral legs Challenging diagnosis given extensive workup thus far. Possibly due to restless leg syndrome given exclusively experiencing symptoms at night. Unfortunately, treatment options  have not been long-lasting. Will proceed with repeat lab work today (CBC, CMP, Ferritin, B12/Folate, Ferritin, Iron/TIBC, CK) Can consider repeat sleep study as well  Patient seen and evaluated with the resident.  I agree with the above plan of care.  We will start with  some updated blood work and I will call Sami with those results when available.  We did discuss a possible repeat sleep study as well.  This note was dictated using Dragon naturally speaking software and may contain errors in syntax, spelling, or content which have not been identified prior to signing this note.

## 2022-03-18 NOTE — Patient Instructions (Signed)
Great seeing you today, Donna Ferrell.  We placed orders for some blood work. We'll call you with the results when they return.

## 2022-03-19 LAB — IRON,TIBC AND FERRITIN PANEL
Ferritin: 25 ng/mL (ref 15–150)
Iron Saturation: 15 % (ref 15–55)
Iron: 58 ug/dL (ref 27–159)
Total Iron Binding Capacity: 379 ug/dL (ref 250–450)
UIBC: 321 ug/dL (ref 131–425)

## 2022-03-19 LAB — CBC
Hematocrit: 39.4 % (ref 34.0–46.6)
Hemoglobin: 13.3 g/dL (ref 11.1–15.9)
MCH: 30.8 pg (ref 26.6–33.0)
MCHC: 33.8 g/dL (ref 31.5–35.7)
MCV: 91 fL (ref 79–97)
Platelets: 176 10*3/uL (ref 150–450)
RBC: 4.32 x10E6/uL (ref 3.77–5.28)
RDW: 14.1 % (ref 11.7–15.4)
WBC: 4.9 10*3/uL (ref 3.4–10.8)

## 2022-03-19 LAB — COMPREHENSIVE METABOLIC PANEL
ALT: 19 IU/L (ref 0–32)
AST: 24 IU/L (ref 0–40)
Albumin/Globulin Ratio: 2.4 — ABNORMAL HIGH (ref 1.2–2.2)
Albumin: 4.5 g/dL (ref 3.8–4.9)
Alkaline Phosphatase: 40 IU/L — ABNORMAL LOW (ref 44–121)
BUN/Creatinine Ratio: 18 (ref 12–28)
BUN: 13 mg/dL (ref 8–27)
Bilirubin Total: 0.4 mg/dL (ref 0.0–1.2)
CO2: 23 mmol/L (ref 20–29)
Calcium: 9.5 mg/dL (ref 8.7–10.3)
Chloride: 103 mmol/L (ref 96–106)
Creatinine, Ser: 0.74 mg/dL (ref 0.57–1.00)
Globulin, Total: 1.9 g/dL (ref 1.5–4.5)
Glucose: 84 mg/dL (ref 70–99)
Potassium: 3.8 mmol/L (ref 3.5–5.2)
Sodium: 141 mmol/L (ref 134–144)
Total Protein: 6.4 g/dL (ref 6.0–8.5)
eGFR: 93 mL/min/{1.73_m2} (ref >59)

## 2022-03-19 LAB — B12 AND FOLATE PANEL
Folate: 11.6 ng/mL (ref >3.0)
Vitamin B-12: 431 pg/mL (ref 232–1245)

## 2022-03-19 LAB — MAGNESIUM: Magnesium: 2.2 mg/dL (ref 1.6–2.3)

## 2022-03-19 LAB — CK: Total CK: 103 U/L (ref 32–182)

## 2022-03-24 ENCOUNTER — Telehealth: Payer: Self-pay | Admitting: Sports Medicine

## 2022-03-24 ENCOUNTER — Encounter: Payer: Self-pay | Admitting: *Deleted

## 2022-03-24 NOTE — Telephone Encounter (Signed)
  I spoke with Donna Ferrell on the phone today after reviewing recent blood work.  Blood work is unremarkable.  She still exhibiting symptoms consistent with restless leg syndrome.  She has localized some of her pain and cramping to the great toe so I wonder if some of her symptoms are originating from her lumbar spine.  She is currently scheduled for lumbar spinal fusion in November.  She is also  taking Lyrica which has not helped much.  After doing a little research about medications for restless leg syndrome, I discovered that dopamine agonists such as Mirapex may be an option but I would like for her to discuss this further with her PCP.

## 2022-04-15 ENCOUNTER — Ambulatory Visit: Payer: 59 | Admitting: Obstetrics and Gynecology
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
## 2022-04-20 ENCOUNTER — Encounter: Payer: Self-pay | Admitting: Obstetrics and Gynecology

## 2022-04-20 ENCOUNTER — Ambulatory Visit (INDEPENDENT_AMBULATORY_CARE_PROVIDER_SITE_OTHER): Payer: 59 | Admitting: Obstetrics and Gynecology

## 2022-04-20 VITALS — BP 114/74 | HR 60 | Ht 64.5 in | Wt 131.0 lb

## 2022-04-20 DIAGNOSIS — Z01419 Encounter for gynecological examination (general) (routine) without abnormal findings: Secondary | ICD-10-CM | POA: Diagnosis not present

## 2022-04-20 NOTE — Patient Instructions (Signed)
EXERCISE AND DIET:  We recommended that you start or continue a regular exercise program for good health. Regular exercise means any activity that makes your heart beat faster and makes you sweat.  We recommend exercising at least 30 minutes per day at least 3 days a week, preferably 4 or 5.  We also recommend a diet low in fat and sugar.  Inactivity, poor dietary choices and obesity can cause diabetes, heart attack, stroke, and kidney damage, among others.    ALCOHOL AND SMOKING:  Women should limit their alcohol intake to no more than 7 drinks/beers/glasses of wine (combined, not each!) per week. Moderation of alcohol intake to this level decreases your risk of breast cancer and liver damage. And of course, no recreational drugs are part of a healthy lifestyle.  And absolutely no smoking or even second hand smoke. Most people know smoking can cause heart and lung diseases, but did you know it also contributes to weakening of your bones? Aging of your skin?  Yellowing of your teeth and nails?  CALCIUM AND VITAMIN D:  Adequate intake of calcium and Vitamin D are recommended.  The recommendations for exact amounts of these supplements seem to change often, but generally speaking 600 mg of calcium (either carbonate or citrate) and 800 units of Vitamin D per day seems prudent. Certain women may benefit from higher intake of Vitamin D.  If you are among these women, your doctor will have told you during your visit.    PAP SMEARS:  Pap smears, to check for cervical cancer or precancers,  have traditionally been done yearly, although recent scientific advances have shown that most women can have pap smears less often.  However, every woman still should have a physical exam from her gynecologist every year. It will include a breast check, inspection of the vulva and vagina to check for abnormal growths or skin changes, a visual exam of the cervix, and then an exam to evaluate the size and shape of the uterus and  ovaries.  And after 61 years of age, a rectal exam is indicated to check for rectal cancers. We will also provide age appropriate advice regarding health maintenance, like when you should have certain vaccines, screening for sexually transmitted diseases, bone density testing, colonoscopy, mammograms, etc.   MAMMOGRAMS:  All women over 40 years old should have a yearly mammogram. Many facilities now offer a "3D" mammogram, which may cost around $50 extra out of pocket. If possible,  we recommend you accept the option to have the 3D mammogram performed.  It both reduces the number of women who will be called back for extra views which then turn out to be normal, and it is better than the routine mammogram at detecting truly abnormal areas.    COLONOSCOPY:  Colonoscopy to screen for colon cancer is recommended for all women at age 50.  We know, you hate the idea of the prep.  We agree, BUT, having colon cancer and not knowing it is worse!!  Colon cancer so often starts as a polyp that can be seen and removed at colonscopy, which can quite literally save your life!  And if your first colonoscopy is normal and you have no family history of colon cancer, most women don't have to have it again for 10 years.  Once every ten years, you can do something that may end up saving your life, right?  We will be happy to help you get it scheduled when you are ready.    Be sure to check your insurance coverage so you understand how much it will cost.  It may be covered as a preventative service at no cost, but you should check your particular policy.    Calcium Content in Foods Calcium is the most abundant mineral in the body. Most of the body's calcium supply is stored in bones and teeth. Calcium helps many parts of the body function normally, including: Blood and blood vessels. Nerves. Hormones. Muscles. Bones and teeth. When your calcium stores are low, you may be at risk for low bone mass, bone loss, and broken bones  (fractures). When you get enough calcium, it helps to support strong bones and teeth throughout your life. Calcium is especially important for: Children during growth spurts. Girls during adolescence. Women who are pregnant or breastfeeding. Women after their menstrual cycle stops (postmenopause). Women whose menstrual cycle has stopped due to anorexia nervosa or regular intense exercise. People who cannot eat or digest dairy products. Vegans. Recommended daily amounts of calcium: Women (ages 19 to 50): 1,000 mg per day. Women (ages 51 and older): 1,200 mg per day. Men (ages 19 to 70): 1,000 mg per day. Men (ages 71 and older): 1,200 mg per day. Women (ages 9 to 18): 1,300 mg per day. Men (ages 9 to 18): 1,300 mg per day. General information Eat foods that are high in calcium. Try to get most of your calcium from food. Some people may benefit from taking calcium supplements. Check with your health care provider or diet and nutrition specialist (dietitian) before starting any calcium supplements. Calcium supplements may interact with certain medicines. Too much calcium may cause other health problems, such as constipation and kidney stones. For the body to absorb calcium, it needs vitamin D. Sources of vitamin D include: Skin exposure to direct sunlight. Foods, such as egg yolks, liver, mushrooms, saltwater fish, and fortified milk. Vitamin D supplements. Check with your health care provider or dietitian before starting any vitamin D supplements. What foods are high in calcium?  Foods that are high in calcium contain more than 100 milligrams per serving. Fruits Fortified orange juice or other fruit juice, 300 mg per 8 oz serving. Vegetables Collard greens, 360 mg per 8 oz serving. Kale, 100 mg per 8 oz serving. Bok choy, 160 mg per 8 oz serving. Grains Fortified ready-to-eat cereals, 100 to 1,000 mg per 8 oz serving. Fortified frozen waffles, 200 mg in 2 waffles. Oatmeal, 140 mg in  1 cup. Meats and other proteins Sardines, canned with bones, 325 mg per 3 oz serving. Salmon, canned with bones, 180 mg per 3 oz serving. Canned shrimp, 125 mg per 3 oz serving. Baked beans, 160 mg per 4 oz serving. Tofu, firm, made with calcium sulfate, 253 mg per 4 oz serving. Dairy Yogurt, plain, low-fat, 310 mg per 6 oz serving. Nonfat milk, 300 mg per 8 oz serving. American cheese, 195 mg per 1 oz serving. Cheddar cheese, 205 mg per 1 oz serving. Cottage cheese 2%, 105 mg per 4 oz serving. Fortified soy, rice, or almond milk, 300 mg per 8 oz serving. Mozzarella, part skim, 210 mg per 1 oz serving. The items listed above may not be a complete list of foods high in calcium. Actual amounts of calcium may be different depending on processing. Contact a dietitian for more information. What foods are lower in calcium? Foods that are lower in calcium contain 50 mg or less per serving. Fruits Apple, about 6 mg. Banana, about 12 mg.   Vegetables Lettuce, 19 mg per 2 oz serving. Tomato, about 11 mg. Grains Rice, 4 mg per 6 oz serving. Boiled potatoes, 14 mg per 8 oz serving. White bread, 6 mg per slice. Meats and other proteins Egg, 27 mg per 2 oz serving. Red meat, 7 mg per 4 oz serving. Chicken, 17 mg per 4 oz serving. Fish, cod, or trout, 20 mg per 4 oz serving. Dairy Cream cheese, regular, 14 mg per 1 Tbsp serving. Brie cheese, 50 mg per 1 oz serving. Parmesan cheese, 70 mg per 1 Tbsp serving. The items listed above may not be a complete list of foods lower in calcium. Actual amounts of calcium may be different depending on processing. Contact a dietitian for more information. Summary Calcium is an important mineral in the body because it affects many functions. Getting enough calcium helps support strong bones and teeth throughout your life. Try to get most of your calcium from food. Calcium supplements may interact with certain medicines. Check with your health care provider  or dietitian before starting any calcium supplements. This information is not intended to replace advice given to you by your health care provider. Make sure you discuss any questions you have with your health care provider. Document Revised: 10/31/2019 Document Reviewed: 10/31/2019 Elsevier Patient Education  2023 Elsevier Inc.  

## 2022-04-30 ENCOUNTER — Other Ambulatory Visit: Payer: Self-pay | Admitting: Neurological Surgery

## 2022-04-30 DIAGNOSIS — M25552 Pain in left hip: Secondary | ICD-10-CM

## 2022-05-13 NOTE — Progress Notes (Signed)
Surgical Instructions    Your procedure is scheduled on Monday, November 6th, 2023.   Report to [facility] Main Entrance "A" at 05:30 A.M., then check in with the Admitting office.  Call this number if you have problems the morning of surgery:  [phone]   If you have any questions prior to your surgery date call [phone]: Open Monday-Friday 8am-4pm  If you experience any cold or flu symptoms such as cough, fever, chills, shortness of breath, etc. between now and your scheduled surgery, please notify us at the above number     Remember:  Do not eat or drink after midnight the night before your surgery    Take these medicines the morning of surgery with A SIP OF WATER:   amLODipine (NORVASC) atorvastatin (LIPITOR) pantoprazole (PROTONIX)  As of today, STOP taking any Aspirin (unless otherwise instructed by your surgeon) Aleve, Naproxen, Ibuprofen, Motrin, Advil, Goody's, BC's, all herbal medications, fish oil, and all vitamins.   The day of surgery:          Do not wear jewelry or makeup. Do not wear lotions, powders, perfumes/cologne or deodorant. Do not shave 48 hours prior to surgery.  Men may shave face and neck. Do not bring valuables to the hospital. Do not wear nail polish, gel polish, artificial nails, or any other type of covering on natural nails (fingers and toes) If you have artificial nails or gel coating that need to be removed by a nail salon, please have this removed prior to surgery. Artificial nails or gel coating may interfere with anesthesia's ability to adequately monitor your vital signs.  [facility] is not responsible for any belongings or valuables.    Do NOT Smoke (Tobacco/Vaping)  24 hours prior to your procedure  If you use a CPAP at night, you may bring your mask for your overnight stay.   Contacts, glasses, hearing aids, dentures or partials may not be worn into surgery, please bring cases for these belongings   For patients admitted to  the hospital, discharge time will be determined by your treatment team.   Patients discharged the day of surgery will not be allowed to drive home, and someone needs to stay with them for 24 hours.   SURGICAL WAITING ROOM VISITATION Patients having surgery or a procedure may have no more than 2 support people in the waiting area - these visitors may rotate.   Children under the age of 60 must have an adult with them who is not the patient. If the patient needs to stay at the hospital during part of their recovery, the visitor guidelines for inpatient rooms apply. Pre-op nurse will coordinate an appropriate time for 1 support person to accompany patient in pre-op.  This support person may not rotate.   Please refer to [email or web address] for the visitor guidelines for Inpatients (after your surgery is over and you are in a regular room).    Special instructions:    Oral Hygiene is also important to reduce your risk of infection.  Remember - BRUSH YOUR TEETH THE MORNING OF SURGERY WITH YOUR REGULAR TOOTHPASTE   [place]- Preparing For Surgery  Before surgery, you can play an important role. Because skin is not sterile, your skin needs to be as free of germs as possible. You can reduce the number of germs on your skin by washing with CHG (chlorahexidine gluconate) Soap before surgery.  CHG is an antiseptic cleaner which kills germs and bonds with the skin to continue killing  germs even after washing.     Please do not use if you have an allergy to CHG or antibacterial soaps. If your skin becomes reddened/irritated stop using the CHG.  Do not shave (including legs and underarms) for at least 48 hours prior to first CHG shower. It is OK to shave your face.  Please follow these instructions carefully.     Shower the NIGHT BEFORE SURGERY and the MORNING OF SURGERY with CHG Soap.   If you chose to wash your hair, wash your hair first as  usual with your normal shampoo. After you shampoo, rinse your hair and body thoroughly to remove the shampoo.  Then [employer] and genitals (private parts) with your normal soap and rinse thoroughly to remove soap.  After that Use CHG Soap as you would any other liquid soap. You can apply CHG directly to the skin and wash gently with a scrungie or a clean washcloth.   Apply the CHG Soap to your body ONLY FROM THE NECK DOWN.  Do not use on open wounds or open sores. Avoid contact with your eyes, ears, mouth and genitals (private parts). Wash Face and genitals (private parts)  with your normal soap.   Wash thoroughly, paying special attention to the area where your surgery will be performed.  Thoroughly rinse your body with warm water from the neck down.  DO NOT shower/wash with your normal soap after using and rinsing off the CHG Soap.  Pat yourself dry with a CLEAN TOWEL.  Wear CLEAN PAJAMAS to bed the night before surgery  Place CLEAN SHEETS on your bed the night before your surgery  DO NOT SLEEP WITH PETS.   Day of Surgery:  Take a shower with CHG soap. Wear Clean/Comfortable clothing the morning of surgery Do not apply any deodorants/lotions.   Remember to brush your teeth WITH YOUR REGULAR TOOTHPASTE.    If you received a COVID test during your pre-op visit, it is requested that you wear a mask when out in public, stay away from anyone that may not be feeling well, and notify your surgeon if you develop symptoms. If you have been in contact with anyone that has tested positive in the last 10 days, please notify your surgeon.    Please read over the following fact sheets that you were given.

## 2022-05-14 ENCOUNTER — Other Ambulatory Visit (HOSPITAL_COMMUNITY): Payer: 59

## 2022-05-14 ENCOUNTER — Encounter (HOSPITAL_COMMUNITY): Payer: Self-pay

## 2022-05-14 ENCOUNTER — Other Ambulatory Visit: Payer: Self-pay | Admitting: Neurological Surgery

## 2022-05-14 ENCOUNTER — Encounter (HOSPITAL_COMMUNITY)
Admission: RE | Admit: 2022-05-14 | Discharge: 2022-05-14 | Disposition: A | Discharge status: Home / Self Care | Payer: 59 | Source: Ambulatory Visit | Attending: Neurological Surgery | Admitting: Neurological Surgery

## 2022-05-14 ENCOUNTER — Other Ambulatory Visit: Payer: Self-pay

## 2022-05-14 VITALS — BP 119/82 | HR 58 | Temp 97.3°F | Resp 18 | Ht 65.0 in | Wt 132.3 lb

## 2022-05-14 DIAGNOSIS — Z01818 Encounter for other preprocedural examination: Secondary | ICD-10-CM | POA: Diagnosis present

## 2022-05-14 DIAGNOSIS — I10 Essential (primary) hypertension: Secondary | ICD-10-CM | POA: Insufficient documentation

## 2022-05-14 HISTORY — DX: Tinnitus, unspecified ear: H93.19

## 2022-05-14 LAB — BASIC METABOLIC PANEL WITH GFR
Anion gap: 4 — ABNORMAL LOW (ref 5–15)
BUN: 11 mg/dL (ref 8–23)
CO2: 28 mmol/L (ref 22–32)
Calcium: 9.1 mg/dL (ref 8.9–10.3)
Chloride: 109 mmol/L (ref 98–111)
Creatinine, Ser: 0.83 mg/dL (ref 0.44–1.00)
GFR, Estimated: >60 mL/min
Glucose, Bld: 80 mg/dL (ref 70–99)
Potassium: 3.9 mmol/L (ref 3.5–5.1)
Sodium: 141 mmol/L (ref 135–145)

## 2022-05-14 LAB — SURGICAL PCR SCREEN
MRSA, PCR: NEGATIVE
Staphylococcus aureus: POSITIVE — AB

## 2022-05-14 LAB — CBC
HCT: 39.4 % (ref 36.0–46.0)
Hemoglobin: 13.1 g/dL (ref 12.0–15.0)
MCH: 30.1 pg (ref 26.0–34.0)
MCHC: 33.2 g/dL (ref 30.0–36.0)
MCV: 90.6 fL (ref 80.0–100.0)
Platelets: 167 K/uL (ref 150–400)
RBC: 4.35 MIL/uL (ref 3.87–5.11)
RDW: 12.7 % (ref 11.5–15.5)
WBC: 4.1 K/uL (ref 4.0–10.5)
nRBC: 0 % (ref 0.0–0.2)

## 2022-05-14 LAB — TYPE AND SCREEN
ABO/RH(D): A POS
Antibody Screen: NEGATIVE

## 2022-05-14 NOTE — Progress Notes (Signed)
PCP - Dr. Marton Redwood Cardiologist - Dr. Buford Dresser (pt has appt to see her on Mon 10/30)  PPM/ICD - denies   Chest x-ray - denies EKG - 05/14/22 Stress Test - denies ECHO - denies Cardiac Cath - denies  Sleep Study - denies  DM- denies  Last dose of GLP1 agonist-  n/a  ASA/Blood Thinner Instructions: n/a   ERAS Protcol - no, NPO   COVID TEST- n/a   Anesthesia review: yes, cardiac hx (Pt has appt with Cardiology on 10/30.  Patient denies shortness of breath, fever, cough and chest pain at PAT appointment   All instructions explained to the patient, with a verbal understanding of the material. Patient agrees to go over the instructions while at home for a better understanding. The opportunity to ask questions was provided.

## 2022-05-14 NOTE — Progress Notes (Signed)
Pt stated that the 2nd consent order was incorrect. Spoke with Maryruth Eve, RN, at Dr. Clarice Pole office. She said she would put in the correct 2nd consent order and the incorrect consent order can be deleted.

## 2022-05-17 ENCOUNTER — Encounter (HOSPITAL_COMMUNITY): Payer: Self-pay | Admitting: Physician Assistant

## 2022-05-17 ENCOUNTER — Ambulatory Visit (HOSPITAL_BASED_OUTPATIENT_CLINIC_OR_DEPARTMENT_OTHER): Payer: 59 | Admitting: Cardiology

## 2022-05-17 ENCOUNTER — Encounter (HOSPITAL_BASED_OUTPATIENT_CLINIC_OR_DEPARTMENT_OTHER): Payer: Self-pay | Admitting: Cardiology

## 2022-05-17 VITALS — BP 136/84 | HR 70 | Ht 65.0 in | Wt 131.0 lb

## 2022-05-17 DIAGNOSIS — Z7189 Other specified counseling: Secondary | ICD-10-CM

## 2022-05-17 DIAGNOSIS — E7800 Pure hypercholesterolemia, unspecified: Secondary | ICD-10-CM | POA: Diagnosis not present

## 2022-05-17 DIAGNOSIS — Z01810 Encounter for preprocedural cardiovascular examination: Secondary | ICD-10-CM | POA: Diagnosis not present

## 2022-05-17 DIAGNOSIS — I2510 Atherosclerotic heart disease of native coronary artery without angina pectoris: Secondary | ICD-10-CM
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
## 2022-05-17 NOTE — Progress Notes (Signed)
Anesthesia Chart Review:  [facility] for history of hypertension and elevated calcium score.  Last seen by Dr. Harrell Gave 05/17/2022 for preop evaluation.  Per note, "Preoperative cardiovascular assessment: According to the Revised Cardiac Risk Index (RCRI), her Perioperative Risk of Major Cardiac Event is (%): 0.4. The patient is not currently having active cardiac symptoms, and they can achieve >4 METs of activity. According to ACC/AHA Guidelines, no further testing is needed. Proceed with surgery at acceptable risk.  Our service is available as needed in the peri-operative period."  Preop labs reviewed, unremarkable.  EKG 05/14/2022: Sinus bradycardia.  Rate 51.    Wynonia Musty [facility] Stay [facility]/Anesthesiology Phone [phone] 05/17/2022 3:44 PM

## 2022-05-17 NOTE — Anesthesia Preprocedure Evaluation (Deleted)
Anesthesia Evaluation    Airway        Dental   Pulmonary           Cardiovascular hypertension,      Neuro/Psych    GI/Hepatic   Endo/Other    Renal/GU      Musculoskeletal   Abdominal   Peds  Hematology   Anesthesia Other Findings   Reproductive/Obstetrics                             Anesthesia Physical Anesthesia Plan  ASA:   Anesthesia Plan:    Post-op Pain Management:    Induction:   PONV Risk Score and Plan:   Airway Management Planned:   Additional Equipment:   Intra-op Plan:   Post-operative Plan:   Informed Consent:   Plan Discussed with:   Anesthesia Plan Comments: (PAT note by Karoline Caldwell, PA-C: [facility] for history of hypertension and elevated calcium score.  Last seen by Dr. Harrell Gave 05/17/2022 for preop evaluation.  Per note, "Preoperative cardiovascular assessment: According to the Revised Cardiac Risk Index (RCRI),herPerioperative Risk of Major Cardiac Event is (%): 0.4. The patient is not currently having active cardiac symptoms, and they can achieve >4 METs of activity. According to ACC/AHA Guidelines, no further testing is needed.Proceed with surgery at acceptable risk. Our service is available as needed in the peri-operative period."  Preop labs reviewed, unremarkable.  EKG 05/14/2022: Sinus bradycardia.  Rate 51. )        Anesthesia Quick Evaluation

## 2022-05-17 NOTE — Patient Instructions (Signed)
Medication Instructions:  None  *If you need a refill on your cardiac medications before your next appointment, please call your pharmacy*   Lab Work: None   Testing/Procedures: None   Follow-Up: At [facility], you and your health needs are our priority.  As part of our continuing mission to provide you with exceptional heart care, we have created designated Provider Care Teams.  These Care Teams include your primary Cardiologist (physician) and Advanced Practice Providers (APPs -  Physician Assistants and Nurse Practitioners) who all work together to provide you with the care you need, when you need it.  We recommend signing up for the patient portal called "MyChart".  Sign up information is provided on this After Visit Summary.  MyChart is used to connect with patients for Virtual Visits (Telemedicine).  Patients are able to view lab/test results, encounter notes, upcoming appointments, etc.  Non-urgent messages can be sent to your provider as well.   To learn more about what you can do with MyChart, go to [email or web address].    Your next appointment:   1 year(s)  The format for your next appointment:   In Person  Provider:   Bridgette Christopher, MD    Other Instructions None         

## 2022-05-20 ENCOUNTER — Other Ambulatory Visit: Payer: 59

## 2022-05-22 ENCOUNTER — Ambulatory Visit
Admission: RE | Admit: 2022-05-22 | Discharge: 2022-05-22 | Disposition: A | Discharge status: Home / Self Care | Payer: 59 | Source: Ambulatory Visit | Attending: Neurological Surgery | Admitting: Neurological Surgery

## 2022-05-22 DIAGNOSIS — M25552 Pain in left hip: Secondary | ICD-10-CM

## 2022-05-24 ENCOUNTER — Encounter (HOSPITAL_COMMUNITY): Admission: RE | Payer: Self-pay | Source: Home / Self Care

## 2022-05-24 ENCOUNTER — Inpatient Hospital Stay (HOSPITAL_COMMUNITY): Admission: RE | Admit: 2022-05-24 | Payer: 59 | Source: Home / Self Care | Admitting: Neurological Surgery

## 2022-05-24 SURGERY — ANTERIOR LATERAL LUMBAR FUSION 3 LEVELS
Anesthesia: General

## 2022-05-26 ENCOUNTER — Other Ambulatory Visit: Payer: Self-pay | Admitting: Neurological Surgery

## 2022-05-31 ENCOUNTER — Encounter (HOSPITAL_COMMUNITY): Admission: RE | Payer: Self-pay | Source: Home / Self Care

## 2022-05-31 ENCOUNTER — Inpatient Hospital Stay (HOSPITAL_COMMUNITY): Admission: RE | Admit: 2022-05-31 | Payer: 59 | Source: Home / Self Care | Admitting: Neurological Surgery

## 2022-05-31 ENCOUNTER — Inpatient Hospital Stay: Admit: 2022-05-31 | Payer: 59 | Admitting: Neurological Surgery

## 2022-05-31 SURGERY — ANTERIOR LATERAL LUMBAR FUSION 3 LEVELS
Anesthesia: General

## 2022-05-31 SURGERY — LAMINECTOMY WITH POSTERIOR LATERAL ARTHRODESIS LEVEL 4
Anesthesia: General

## 2022-06-03 NOTE — Progress Notes (Signed)
LVM with surgery date 06/04/22, time [phone], arrival 0530, stop eating solid food by midnight, and stop drinking clear liquids by 0430.

## 2022-06-04 ENCOUNTER — Inpatient Hospital Stay (HOSPITAL_COMMUNITY): Payer: 59

## 2022-06-04 ENCOUNTER — Inpatient Hospital Stay (HOSPITAL_COMMUNITY): Payer: 59 | Admitting: Anesthesiology

## 2022-06-04 ENCOUNTER — Inpatient Hospital Stay (HOSPITAL_COMMUNITY)
Admission: RE | Admit: 2022-06-04 | Discharge: 2022-06-05 | DRG: 455 | Disposition: A | Discharge status: Home / Self Care | Payer: 59 | Attending: Neurological Surgery | Admitting: Neurological Surgery

## 2022-06-04 ENCOUNTER — Other Ambulatory Visit: Payer: Self-pay

## 2022-06-04 ENCOUNTER — Inpatient Hospital Stay: Admit: 2022-06-04 | Payer: 59 | Admitting: Neurological Surgery

## 2022-06-04 ENCOUNTER — Encounter (HOSPITAL_COMMUNITY)
Admission: RE | Disposition: A | Discharge status: Home / Self Care | Payer: Self-pay | Source: Home / Self Care | Attending: Neurological Surgery

## 2022-06-04 DIAGNOSIS — E7800 Pure hypercholesterolemia, unspecified: Secondary | ICD-10-CM | POA: Diagnosis present

## 2022-06-04 DIAGNOSIS — I2510 Atherosclerotic heart disease of native coronary artery without angina pectoris: Secondary | ICD-10-CM | POA: Diagnosis present

## 2022-06-04 DIAGNOSIS — Z83438 Family history of other disorder of lipoprotein metabolism and other lipidemia: Secondary | ICD-10-CM | POA: Diagnosis not present

## 2022-06-04 DIAGNOSIS — Z79899 Other long term (current) drug therapy: Secondary | ICD-10-CM | POA: Diagnosis not present

## 2022-06-04 DIAGNOSIS — Z820 Family history of epilepsy and other diseases of the nervous system: Secondary | ICD-10-CM | POA: Diagnosis not present

## 2022-06-04 DIAGNOSIS — K219 Gastro-esophageal reflux disease without esophagitis: Secondary | ICD-10-CM | POA: Diagnosis present

## 2022-06-04 DIAGNOSIS — Z803 Family history of malignant neoplasm of breast: Secondary | ICD-10-CM

## 2022-06-04 DIAGNOSIS — M4156 Other secondary scoliosis, lumbar region: Secondary | ICD-10-CM

## 2022-06-04 DIAGNOSIS — M48062 Spinal stenosis, lumbar region with neurogenic claudication: Secondary | ICD-10-CM | POA: Diagnosis present

## 2022-06-04 DIAGNOSIS — Z833 Family history of diabetes mellitus: Secondary | ICD-10-CM | POA: Diagnosis not present

## 2022-06-04 DIAGNOSIS — Z818 Family history of other mental and behavioral disorders: Secondary | ICD-10-CM | POA: Diagnosis not present

## 2022-06-04 DIAGNOSIS — Z8349 Family history of other endocrine, nutritional and metabolic diseases: Secondary | ICD-10-CM | POA: Diagnosis not present

## 2022-06-04 DIAGNOSIS — Z85828 Personal history of other malignant neoplasm of skin: Secondary | ICD-10-CM | POA: Diagnosis not present

## 2022-06-04 DIAGNOSIS — I10 Essential (primary) hypertension: Secondary | ICD-10-CM | POA: Diagnosis present

## 2022-06-04 DIAGNOSIS — Z981 Arthrodesis status: Secondary | ICD-10-CM | POA: Diagnosis not present

## 2022-06-04 DIAGNOSIS — Z8249 Family history of ischemic heart disease and other diseases of the circulatory system: Secondary | ICD-10-CM

## 2022-06-04 DIAGNOSIS — M4726 Other spondylosis with radiculopathy, lumbar region: Secondary | ICD-10-CM | POA: Diagnosis present

## 2022-06-04 HISTORY — PX: OTHER SURGICAL HISTORY: SHX169

## 2022-06-04 LAB — TYPE AND SCREEN
ABO/RH(D): A POS
Antibody Screen: NEGATIVE

## 2022-06-04 SURGERY — LAMINECTOMY WITH POSTERIOR LATERAL ARTHRODESIS LEVEL 4
Anesthesia: General

## 2022-06-04 SURGERY — POSTERIOR LUMBAR FUSION 3 LEVEL
Anesthesia: General | Site: Back

## 2022-06-04 MED ORDER — ONDANSETRON HCL 4 MG/2ML IJ SOLN
INTRAMUSCULAR | Status: DC | PRN
  Administered 2022-06-04: 4 mg via INTRAVENOUS

## 2022-06-04 MED ORDER — PANTOPRAZOLE SODIUM 40 MG PO TBEC
40.0000 mg | DELAYED_RELEASE_TABLET | Freq: Every day | ORAL | Status: DC
  Administered 2022-06-05: 40 mg via ORAL
  Filled 2022-06-04: qty 1

## 2022-06-04 MED ORDER — LACTATED RINGERS IV SOLN: INTRAVENOUS | Status: DC

## 2022-06-04 MED ORDER — EPHEDRINE 5 MG/ML INJ
INTRAVENOUS | Status: AC
  Filled 2022-06-04: qty 5

## 2022-06-04 MED ORDER — ROCURONIUM BROMIDE 10 MG/ML (PF) SYRINGE
PREFILLED_SYRINGE | INTRAVENOUS | Status: DC | PRN
  Administered 2022-06-04 (×2): 20 mg via INTRAVENOUS
  Administered 2022-06-04: 40 mg via INTRAVENOUS
  Administered 2022-06-04 (×4): 20 mg via INTRAVENOUS

## 2022-06-04 MED ORDER — DOCUSATE SODIUM 100 MG PO CAPS
100.0000 mg | ORAL_CAPSULE | Freq: Two times a day (BID) | ORAL | Status: DC
  Administered 2022-06-04 – 2022-06-05 (×2): 100 mg via ORAL
  Filled 2022-06-04 (×2): qty 1

## 2022-06-04 MED ORDER — ONDANSETRON HCL 4 MG PO TABS: 4.0000 mg | ORAL_TABLET | Freq: Four times a day (QID) | ORAL | Status: DC | PRN

## 2022-06-04 MED ORDER — THROMBIN 5000 UNITS EX SOLR
CUTANEOUS | Status: AC
  Filled 2022-06-04: qty 5000

## 2022-06-04 MED ORDER — PHENOL 1.4 % MT LIQD: 1.0000 | OROMUCOSAL | Status: DC | PRN

## 2022-06-04 MED ORDER — ACETAMINOPHEN 325 MG PO TABS: 650.0000 mg | ORAL_TABLET | ORAL | Status: DC | PRN

## 2022-06-04 MED ORDER — SUGAMMADEX SODIUM 200 MG/2ML IV SOLN
INTRAVENOUS | Status: DC | PRN
  Administered 2022-06-04: 200 mg via INTRAVENOUS

## 2022-06-04 MED ORDER — THROMBIN 20000 UNITS EX SOLR
CUTANEOUS | Status: AC
  Filled 2022-06-04: qty 20000

## 2022-06-04 MED ORDER — MENTHOL 3 MG MT LOZG: 1.0000 | LOZENGE | OROMUCOSAL | Status: DC | PRN

## 2022-06-04 MED ORDER — FENTANYL CITRATE (PF) 250 MCG/5ML IJ SOLN
INTRAMUSCULAR | Status: AC
  Filled 2022-06-04: qty 5

## 2022-06-04 MED ORDER — BISACODYL 10 MG RE SUPP: 10.0000 mg | Freq: Every day | RECTAL | Status: DC | PRN

## 2022-06-04 MED ORDER — SODIUM CHLORIDE 0.9% FLUSH
3.0000 mL | Freq: Two times a day (BID) | INTRAVENOUS | Status: DC
  Administered 2022-06-04: 3 mL via INTRAVENOUS

## 2022-06-04 MED ORDER — LIDOCAINE 2% (20 MG/ML) 5 ML SYRINGE
INTRAMUSCULAR | Status: DC | PRN
  Administered 2022-06-04: 60 mg via INTRAVENOUS

## 2022-06-04 MED ORDER — KETAMINE HCL-SODIUM CHLORIDE 100-0.9 MG/10ML-% IV SOSY
PREFILLED_SYRINGE | INTRAVENOUS | Status: DC | PRN
  Administered 2022-06-04: 30 mg via INTRAVENOUS
  Administered 2022-06-04 (×2): 10 mg via INTRAVENOUS

## 2022-06-04 MED ORDER — OXYCODONE-ACETAMINOPHEN 5-325 MG PO TABS
1.0000 | ORAL_TABLET | ORAL | Status: DC | PRN
  Administered 2022-06-04 – 2022-06-05 (×5): 2 via ORAL
  Filled 2022-06-04 (×5): qty 2

## 2022-06-04 MED ORDER — ROPINIROLE HCL 1 MG PO TABS
0.5000 mg | ORAL_TABLET | Freq: Every day | ORAL | Status: DC
  Administered 2022-06-04: 0.5 mg via ORAL
  Filled 2022-06-04: qty 1

## 2022-06-04 MED ORDER — ALUM & MAG HYDROXIDE-SIMETH 200-200-20 MG/5ML PO SUSP: 30.0000 mL | Freq: Four times a day (QID) | ORAL | Status: DC | PRN

## 2022-06-04 MED ORDER — THROMBIN 5000 UNITS EX SOLR
OROMUCOSAL | Status: DC | PRN
  Administered 2022-06-04: 5 mL via TOPICAL

## 2022-06-04 MED ORDER — PREGABALIN 75 MG PO CAPS
75.0000 mg | ORAL_CAPSULE | Freq: Every day | ORAL | Status: DC
  Administered 2022-06-04: 75 mg via ORAL
  Filled 2022-06-04: qty 1

## 2022-06-04 MED ORDER — SODIUM CHLORIDE 0.9% FLUSH: 3.0000 mL | INTRAVENOUS | Status: DC | PRN

## 2022-06-04 MED ORDER — PHENYLEPHRINE HCL-NACL 20-0.9 MG/250ML-% IV SOLN
INTRAVENOUS | Status: DC | PRN
  Administered 2022-06-04: 40 ug/min via INTRAVENOUS

## 2022-06-04 MED ORDER — CHLORHEXIDINE GLUCONATE CLOTH 2 % EX PADS: 6.0000 | MEDICATED_PAD | Freq: Once | CUTANEOUS | Status: DC

## 2022-06-04 MED ORDER — CEFAZOLIN SODIUM-DEXTROSE 2-4 GM/100ML-% IV SOLN
2.0000 g | INTRAVENOUS | Status: AC
  Administered 2022-06-04 (×2): 2 g via INTRAVENOUS
  Filled 2022-06-04: qty 100

## 2022-06-04 MED ORDER — CEFAZOLIN SODIUM-DEXTROSE 2-4 GM/100ML-% IV SOLN
2.0000 g | Freq: Three times a day (TID) | INTRAVENOUS | Status: AC
  Administered 2022-06-04 – 2022-06-05 (×2): 2 g via INTRAVENOUS
  Filled 2022-06-04 (×2): qty 100

## 2022-06-04 MED ORDER — 0.9 % SODIUM CHLORIDE (POUR BTL) OPTIME
TOPICAL | Status: DC | PRN
  Administered 2022-06-04 (×2): 1000 mL

## 2022-06-04 MED ORDER — METHOCARBAMOL 1000 MG/10ML IJ SOLN: 500.0000 mg | Freq: Four times a day (QID) | INTRAVENOUS | Status: DC | PRN

## 2022-06-04 MED ORDER — HYDROMORPHONE HCL 1 MG/ML IJ SOLN
0.2500 mg | INTRAMUSCULAR | Status: DC | PRN
  Administered 2022-06-04 (×3): 0.5 mg via INTRAVENOUS

## 2022-06-04 MED ORDER — SODIUM CHLORIDE 0.9 % IV SOLN: 250.0000 mL | INTRAVENOUS | Status: DC

## 2022-06-04 MED ORDER — ORAL CARE MOUTH RINSE: 15.0000 mL | Freq: Once | OROMUCOSAL | Status: AC

## 2022-06-04 MED ORDER — ALPRAZOLAM 0.25 MG PO TABS: 0.2500 mg | ORAL_TABLET | Freq: Every evening | ORAL | Status: DC | PRN

## 2022-06-04 MED ORDER — BUPIVACAINE HCL (PF) 0.5 % IJ SOLN
INTRAMUSCULAR | Status: AC
  Filled 2022-06-04: qty 30

## 2022-06-04 MED ORDER — AMISULPRIDE (ANTIEMETIC) 5 MG/2ML IV SOLN: 10.0000 mg | Freq: Once | INTRAVENOUS | Status: DC | PRN

## 2022-06-04 MED ORDER — AMLODIPINE BESYLATE 5 MG PO TABS
5.0000 mg | ORAL_TABLET | Freq: Every day | ORAL | Status: DC
  Administered 2022-06-04: 5 mg via ORAL
  Filled 2022-06-04 (×2): qty 1

## 2022-06-04 MED ORDER — MELATONIN 1 MG PO TABS
1.0000 mg | ORAL_TABLET | Freq: Every day | ORAL | Status: DC
  Filled 2022-06-04: qty 1

## 2022-06-04 MED ORDER — PROPOFOL 10 MG/ML IV BOLUS
INTRAVENOUS | Status: DC | PRN
  Administered 2022-06-04: 100 mg via INTRAVENOUS

## 2022-06-04 MED ORDER — KETOROLAC TROMETHAMINE 15 MG/ML IJ SOLN
INTRAMUSCULAR | Status: AC
  Administered 2022-06-04: 15 mg
  Filled 2022-06-04: qty 1

## 2022-06-04 MED ORDER — LIDOCAINE 2% (20 MG/ML) 5 ML SYRINGE
INTRAMUSCULAR | Status: AC
  Filled 2022-06-04: qty 5

## 2022-06-04 MED ORDER — FENTANYL CITRATE (PF) 250 MCG/5ML IJ SOLN
INTRAMUSCULAR | Status: DC | PRN
  Administered 2022-06-04 (×2): 50 ug via INTRAVENOUS
  Administered 2022-06-04 (×3): 25 ug via INTRAVENOUS
  Administered 2022-06-04: 50 ug via INTRAVENOUS
  Administered 2022-06-04: 25 ug via INTRAVENOUS
  Administered 2022-06-04 (×3): 50 ug via INTRAVENOUS
  Administered 2022-06-04: 100 ug via INTRAVENOUS

## 2022-06-04 MED ORDER — ROCURONIUM BROMIDE 10 MG/ML (PF) SYRINGE
PREFILLED_SYRINGE | INTRAVENOUS | Status: AC
  Filled 2022-06-04: qty 10

## 2022-06-04 MED ORDER — MIDAZOLAM HCL 2 MG/2ML IJ SOLN
INTRAMUSCULAR | Status: AC
  Filled 2022-06-04: qty 2

## 2022-06-04 MED ORDER — SENNA 8.6 MG PO TABS
1.0000 | ORAL_TABLET | Freq: Two times a day (BID) | ORAL | Status: DC
  Administered 2022-06-04 – 2022-06-05 (×2): 8.6 mg via ORAL
  Filled 2022-06-04 (×2): qty 1

## 2022-06-04 MED ORDER — LACTATED RINGERS IV SOLN: INTRAVENOUS | Status: DC | PRN

## 2022-06-04 MED ORDER — ACETAMINOPHEN 650 MG RE SUPP: 650.0000 mg | RECTAL | Status: DC | PRN

## 2022-06-04 MED ORDER — KETOROLAC TROMETHAMINE 15 MG/ML IJ SOLN
15.0000 mg | Freq: Four times a day (QID) | INTRAMUSCULAR | Status: AC
  Administered 2022-06-04 – 2022-06-05 (×3): 15 mg via INTRAVENOUS
  Filled 2022-06-04 (×2): qty 1

## 2022-06-04 MED ORDER — NITROGLYCERIN 0.4 MG/HR TD PT24
0.4000 mg | MEDICATED_PATCH | Freq: Every day | TRANSDERMAL | Status: DC
  Filled 2022-06-04 (×2): qty 1

## 2022-06-04 MED ORDER — MIDAZOLAM HCL 2 MG/2ML IJ SOLN
INTRAMUSCULAR | Status: DC | PRN
  Administered 2022-06-04: 2 mg via INTRAVENOUS

## 2022-06-04 MED ORDER — THROMBIN 20000 UNITS EX SOLR
CUTANEOUS | Status: DC | PRN
  Administered 2022-06-04: 20 mL via TOPICAL

## 2022-06-04 MED ORDER — DEXAMETHASONE SODIUM PHOSPHATE 10 MG/ML IJ SOLN
INTRAMUSCULAR | Status: AC
  Filled 2022-06-04: qty 1

## 2022-06-04 MED ORDER — METHOCARBAMOL 500 MG PO TABS
500.0000 mg | ORAL_TABLET | Freq: Four times a day (QID) | ORAL | Status: DC | PRN
  Administered 2022-06-04 – 2022-06-05 (×3): 500 mg via ORAL
  Filled 2022-06-04 (×3): qty 1

## 2022-06-04 MED ORDER — CEFAZOLIN SODIUM 1 G IJ SOLR
INTRAMUSCULAR | Status: AC
  Filled 2022-06-04: qty 20

## 2022-06-04 MED ORDER — BUPIVACAINE HCL (PF) 0.5 % IJ SOLN
INTRAMUSCULAR | Status: DC | PRN
  Administered 2022-06-04: 10 mL

## 2022-06-04 MED ORDER — KETAMINE HCL 50 MG/5ML IJ SOSY
PREFILLED_SYRINGE | INTRAMUSCULAR | Status: AC
  Filled 2022-06-04: qty 5

## 2022-06-04 MED ORDER — CHLORHEXIDINE GLUCONATE 0.12 % MT SOLN
15.0000 mL | Freq: Once | OROMUCOSAL | Status: AC
  Administered 2022-06-04: 15 mL via OROMUCOSAL
  Filled 2022-06-04: qty 15

## 2022-06-04 MED ORDER — MELATONIN 3 MG PO TABS
1.5000 mg | ORAL_TABLET | Freq: Every day | ORAL | Status: DC
  Administered 2022-06-04: 1.5 mg via ORAL
  Filled 2022-06-04: qty 0.5

## 2022-06-04 MED ORDER — FLEET ENEMA 7-19 GM/118ML RE ENEM: 1.0000 | ENEMA | Freq: Once | RECTAL | Status: DC | PRN

## 2022-06-04 MED ORDER — LIDOCAINE-EPINEPHRINE 1 %-1:100000 IJ SOLN
INTRAMUSCULAR | Status: DC | PRN
  Administered 2022-06-04: 10 mL

## 2022-06-04 MED ORDER — ONDANSETRON HCL 4 MG/2ML IJ SOLN
INTRAMUSCULAR | Status: AC
  Filled 2022-06-04: qty 2

## 2022-06-04 MED ORDER — ACETAMINOPHEN 500 MG PO TABS
1000.0000 mg | ORAL_TABLET | Freq: Once | ORAL | Status: AC
  Administered 2022-06-04: 1000 mg via ORAL
  Filled 2022-06-04: qty 2

## 2022-06-04 MED ORDER — DEXAMETHASONE SODIUM PHOSPHATE 10 MG/ML IJ SOLN
INTRAMUSCULAR | Status: DC | PRN
  Administered 2022-06-04: 10 mg via INTRAVENOUS

## 2022-06-04 MED ORDER — HYDROMORPHONE HCL 1 MG/ML IJ SOLN
INTRAMUSCULAR | Status: AC
  Filled 2022-06-04: qty 1

## 2022-06-04 MED ORDER — SODIUM CHLORIDE 0.9 % IV SOLN: INTRAVENOUS | Status: DC | PRN

## 2022-06-04 MED ORDER — ONDANSETRON HCL 4 MG/2ML IJ SOLN
4.0000 mg | Freq: Four times a day (QID) | INTRAMUSCULAR | Status: DC | PRN
  Administered 2022-06-04: 4 mg via INTRAVENOUS
  Filled 2022-06-04: qty 2

## 2022-06-04 MED ORDER — MORPHINE SULFATE (PF) 2 MG/ML IV SOLN: 2.0000 mg | INTRAVENOUS | Status: DC | PRN

## 2022-06-04 MED ORDER — EPHEDRINE SULFATE-NACL 50-0.9 MG/10ML-% IV SOSY
PREFILLED_SYRINGE | INTRAVENOUS | Status: DC | PRN
  Administered 2022-06-04: 5 mg via INTRAVENOUS
  Administered 2022-06-04: 10 mg via INTRAVENOUS
  Administered 2022-06-04: 5 mg via INTRAVENOUS

## 2022-06-04 MED ORDER — POLYETHYLENE GLYCOL 3350 17 G PO PACK: 17.0000 g | PACK | Freq: Every day | ORAL | Status: DC | PRN

## 2022-06-04 MED ORDER — ATORVASTATIN CALCIUM 10 MG PO TABS
20.0000 mg | ORAL_TABLET | Freq: Every day | ORAL | Status: DC
  Administered 2022-06-04: 20 mg via ORAL
  Filled 2022-06-04 (×2): qty 2

## 2022-06-04 MED ORDER — LIDOCAINE-EPINEPHRINE 1 %-1:100000 IJ SOLN
INTRAMUSCULAR | Status: AC
  Filled 2022-06-04: qty 1

## 2022-06-04 SURGICAL SUPPLY — 79 items
ADH SKN CLS APL DERMABOND .7 (GAUZE/BANDAGES/DRESSINGS) ×1
APL SRG 60D 8 XTD TIP BNDBL (TIP)
BAG COUNTER SPONGE SURGICOUNT (BAG) ×1 IMPLANT
BAG SPNG CNTER NS LX DISP (BAG) ×2
BASKET BONE COLLECTION (BASKET) ×1 IMPLANT
BLADE BONE MILL MEDIUM (MISCELLANEOUS) ×1 IMPLANT
BLADE CLIPPER SURG (BLADE) IMPLANT
BONE CANC CHIPS 40CC CAN1/2 (Bone Implant) ×1 IMPLANT
BUR MATCHSTICK NEURO 3.0 LAGG (BURR) ×1 IMPLANT
CAGE COROENT MP 8X9X23M-8 SPIN (Cage) IMPLANT
CAGE PLIF 8X9X23-12 LUMBAR (Cage) IMPLANT
CANISTER SUCT 3000ML PPV (MISCELLANEOUS) ×1 IMPLANT
CEMENT MAX VISCOSITY NUVA (Cement) IMPLANT
CHIPS CANC BONE 40CC CAN1/2 (Bone Implant) ×1 IMPLANT
CNTNR URN SCR LID CUP LEK RST (MISCELLANEOUS) ×1 IMPLANT
CONT SPEC 4OZ STRL OR WHT (MISCELLANEOUS) ×1
COVER BACK TABLE 60X90IN (DRAPES) ×1 IMPLANT
DERMABOND ADVANCED .7 DNX12 (GAUZE/BANDAGES/DRESSINGS) ×1 IMPLANT
DEVICE DISSECT PLASMABLAD 3.0S (MISCELLANEOUS) ×1 IMPLANT
DRAPE C-ARM 42X72 X-RAY (DRAPES) ×2 IMPLANT
DRAPE C-ARMOR (DRAPES) IMPLANT
DRAPE HALF SHEET 40X57 (DRAPES) IMPLANT
DRAPE LAPAROTOMY 100X72X124 (DRAPES) ×1 IMPLANT
DRAPE WARM FLUID 44X44 (DRAPES) IMPLANT
DRSG OPSITE POSTOP 4X8 (GAUZE/BANDAGES/DRESSINGS) IMPLANT
DURAPREP 26ML APPLICATOR (WOUND CARE) ×1 IMPLANT
DURASEAL APPLICATOR TIP (TIP) IMPLANT
DURASEAL SPINE SEALANT 3ML (MISCELLANEOUS) IMPLANT
ELECT REM PT RETURN 9FT ADLT (ELECTROSURGICAL) ×1
ELECTRODE REM PT RTRN 9FT ADLT (ELECTROSURGICAL) ×1 IMPLANT
GAUZE 4X4 16PLY ~~LOC~~+RFID DBL (SPONGE) IMPLANT
GAUZE SPONGE 4X4 12PLY STRL (GAUZE/BANDAGES/DRESSINGS) ×1 IMPLANT
GLOVE BIOGEL PI IND STRL 6.5 (GLOVE) IMPLANT
GLOVE BIOGEL PI IND STRL 7.5 (GLOVE) IMPLANT
GLOVE BIOGEL PI IND STRL 8 (GLOVE) IMPLANT
GLOVE BIOGEL PI IND STRL 8.5 (GLOVE) ×2 IMPLANT
GLOVE ECLIPSE 8.5 STRL (GLOVE) ×2 IMPLANT
GLOVE SURG SS PI 7.5 STRL IVOR (GLOVE) IMPLANT
GOWN STRL REUS W/ TWL LRG LVL3 (GOWN DISPOSABLE) IMPLANT
GOWN STRL REUS W/ TWL XL LVL3 (GOWN DISPOSABLE) IMPLANT
GOWN STRL REUS W/TWL 2XL LVL3 (GOWN DISPOSABLE) ×2 IMPLANT
GOWN STRL REUS W/TWL LRG LVL3 (GOWN DISPOSABLE) ×1
GOWN STRL REUS W/TWL XL LVL3 (GOWN DISPOSABLE) ×1
GRAFT BNE CHIP CANC 1-8 40 (Bone Implant) IMPLANT
GRAFT BONE PROTEIOS XL 10CC (Orthopedic Implant) IMPLANT
HEMOSTAT POWDER KIT SURGIFOAM (HEMOSTASIS) IMPLANT
KIT BASIN OR (CUSTOM PROCEDURE TRAY) ×1 IMPLANT
KIT GRAFTMAG DEL NEURO DISP (NEUROSURGERY SUPPLIES) IMPLANT
KIT TURNOVER KIT B (KITS) ×1 IMPLANT
MILL BONE PREP (MISCELLANEOUS) ×1 IMPLANT
NDL RELINE-OR FENS 16 (NEEDLE) IMPLANT
NEEDLE HYPO 22GX1.5 SAFETY (NEEDLE) ×1 IMPLANT
NEEDLE RELINE-OR FENS 16 (NEEDLE) ×9 IMPLANT
NS IRRIG 1000ML POUR BTL (IV SOLUTION) ×1 IMPLANT
PACK LAMINECTOMY NEURO (CUSTOM PROCEDURE TRAY) ×1 IMPLANT
PAD ARMBOARD 7.5X6 YLW CONV (MISCELLANEOUS) ×3 IMPLANT
PATTIES SURGICAL .5 X1 (DISPOSABLE) ×1 IMPLANT
PLASMABLADE 3.0S (MISCELLANEOUS) ×1
PUSHER RELINE FENS 36 OR (MISCELLANEOUS) IMPLANT
ROD RELINE-O 5.5X120MM LORD (Rod) IMPLANT
SCREW LOCK RELINE 5.5 TULIP (Screw) IMPLANT
SCREW RELINE PA 6.5X45 (Screw) IMPLANT
SCREW RELINE-O POLY 6.5X45 (Screw) IMPLANT
SPIKE FLUID TRANSFER (MISCELLANEOUS) ×1 IMPLANT
SPONGE SURGIFOAM ABS GEL 100 (HEMOSTASIS) ×1 IMPLANT
SPONGE T-LAP 4X18 ~~LOC~~+RFID (SPONGE) IMPLANT
SUT PROLENE 6 0 BV (SUTURE) IMPLANT
SUT VIC AB 1 CT1 18XBRD ANBCTR (SUTURE) ×1 IMPLANT
SUT VIC AB 1 CT1 8-18 (SUTURE) ×2
SUT VIC AB 2-0 CP2 18 (SUTURE) ×1 IMPLANT
SUT VIC AB 3-0 SH 8-18 (SUTURE) ×1 IMPLANT
SUT VIC AB 4-0 RB1 18 (SUTURE) ×1 IMPLANT
SYR 3ML LL SCALE MARK (SYRINGE) ×4 IMPLANT
TIP FENS REPLACE RELINE (MISCELLANEOUS) IMPLANT
TOWEL GREEN STERILE (TOWEL DISPOSABLE) ×1 IMPLANT
TOWEL GREEN STERILE FF (TOWEL DISPOSABLE) ×1 IMPLANT
TRAY FOLEY MTR SLVR 14FR STAT (SET/KITS/TRAYS/PACK) IMPLANT
TRAY FOLEY MTR SLVR 16FR STAT (SET/KITS/TRAYS/PACK) ×1 IMPLANT
WATER STERILE IRR 1000ML POUR (IV SOLUTION) ×1 IMPLANT

## 2022-06-04 NOTE — Progress Notes (Signed)
Orthopedic Tech Progress Note Patient Details:  Donna Ferrell Sep 05, 1960 [id number]  Went to deliver back brace to the pt. Pt and nurse stated that the pt already had a brace.     Patient ID: Donna Ferrell, adult   DOB: 22-Aug-1960, 61 y.o.   MRN: [id number]  Donna Ferrell 06/04/2022, 4:27 PM

## 2022-06-04 NOTE — Anesthesia Procedure Notes (Signed)
Procedure Name: Intubation Date/Time: 06/04/2022 8:19 AM  Performed by: Pearson Grippe, CRNAPre-anesthesia Checklist: Patient identified, Emergency Drugs available, Suction available and Patient being monitored Patient Re-evaluated:Patient Re-evaluated prior to induction Oxygen Delivery Method: Circle system utilized Preoxygenation: Pre-oxygenation with 100% oxygen Induction Type: IV induction Ventilation: Mask ventilation without difficulty Laryngoscope Size: Miller and 2 Grade View: Grade I Tube type: Oral Tube size: 7.0 mm Number of attempts: 1 Airway Equipment and Method: Stylet Placement Confirmation: ETT inserted through vocal cords under direct vision, positive ETCO2 and breath sounds checked- equal and bilateral Secured at: 20 cm Tube secured with: Tape Dental Injury: Teeth and Oropharynx as per pre-operative assessment

## 2022-06-04 NOTE — Discharge Instructions (Signed)
Wound Care Remove dressing in 3 days Leave incision open to air. You may shower. Do not scrub directly on incision.  Do not put any creams, lotions, or ointments on incision. Activity Walk each and every day, increasing distance each day. No lifting greater than 5 lbs.  Avoid bending, arching, and twisting. No driving for 2 weeks; may ride as a passenger locally. If provided with back brace, wear when out of bed.  It is not necessary to wear in bed. Diet Resume your normal diet.  Return to Work Will be discussed at you follow up appointment. Call Your Doctor If Any of These Occur Redness, drainage, or swelling at the wound.  Temperature greater than 101 degrees. Severe pain not relieved by pain medication. Incision starts to come apart. Follow Up Appt Call today for appointment in 3 weeks [phone]) or for problems.  If you have any hardware placed in your spine, you will need an x-ray before your appointment.  

## 2022-06-04 NOTE — Op Note (Signed)
Date of surgery: 06/04/2022 Preoperative diagnosis: #1 degenerative scoliosis of the lumbar spine #2 lumbar spinal stenosis with lateral recess stenosis on the left L2-3 L3-4 L4-5 3.  Lumbar radiculopathy 4.  History of fusion L5-S1 Postoperative diagnosis: Same Procedure: Decompression L2-3 L3-4 L4-5 with more work than required for simple interbody technique.  Posterior lumbar interbody arthrodesis via left-sided approach at L2-3 L3-4 L4-5 with local autograft allograft and Protei OS.  Removal of hardware L5-S1.  Arthrodesis from L2 to the sacrum with local autograft allograft and Protei OS.  Pedicle screw fixation L2 to the sacrum with fluoroscopic guidance to place pedicle screws.  Methacrylate augmentation of pedicle screws at L2-L3 and L4. Surgeon: Barnett Abu First Assistant: Julio Sicks, MD Anesthesia: General endotracheal Indications: Donna Ferrell is a 61 year old individual whose previously had a spondylolisthesis at L5-S1 she underwent a posterior interbody arthrodesis and did well for a few years however she has developed progressive degenerative scoliosis of the lumbar spine with left-sided degeneration at L2 [phone] creating a concavity in the curve of the lumbar spine.  She has been treated conservatively but seems to be refractory with increasing pain and weakness on the left side.  Is been advised regarding the need for surgery to decompress and stabilize L2 to the sacrum.  Procedure: The patient was brought to the operating room supine on the stretcher.  After the smooth induction of general endotracheal anesthesia, she was carefully turned prone.  The back was prepped with alcohol DuraPrep and draped in a sterile fashion.  Fluoroscopic guidance was used to localize areas of L2-L3-L4 prior to making the skin incision and the skin incision was then marked.  20 cc of half percent Marcaine along with 1% lidocaine with epinephrine in a 50-50 mixture was injected subcutaneously.  Incision  was made in the midline and the dissection was carried down to the lumbodorsal fascia.  Fascia was opened on either side of midline and a subperiosteal dissection was performed to expose the spinous processes and laminar arch of L2 L3-L4-L5 and the sacrum.  The previously placed hardware at L5 and the sacrum was then uncovered and skeletonized.  Then by removing the The Screws the Rods Could Be Removed and Ultimately the Screws Could Be Removed from the L5 and S1.  The Arthrodesis Was Examined and Noted to Be Very Solid at the L5-S1 Level.  Then Attention Was Turned to L4-5 Dissection Was Carried out Either Side of Midline and Particularly on the Left Side Where There Was Significant Hypertrophy of the Facet Joints Area Was Cleared.  A Laminotomy Was Created Removing the Inferior Margin Lamina of L4 out to and Including the Entirety of the Facet at L4-5, Dural Tube Was Carefully Exposed the Bone Was Saved for and Harvested for Use of Graft but Then the Decompression Was Undertaken to Remove All the Thickened Redundant L Ligament Exposed the Path of the L4 Nerve Root Superiorly and the L5 Nerve Root Inferiorly This Was Done and the Disc Space Was Isolated.  Then the Disc Was Entered and a Decompression of the Disc Space Was Performed Removing a Substantial Quantity of Severely Degenerated Desiccated Disc Material.  The Disc Space Could Be Mobilized Then and by Opening It We Are Able to Reduce Some of the Degenerative Scoliosis That Was Forming in This Region.  The Space Was Then Cleared As for over to the Right Side As Was Possible through This Left-Sided Approach.  The Endplates Were Curettaged.  The Interspace Was Sized for Appropriate  Size Spacer and It Was Felt That an 8 x 9 x 23 Mm Spacer with 12 Degrees of Lordosis Would Fit Best into This Interval.  We Then Prepared the L3-4 Disc and the L2-3 Disc by a Similar Laminotomy with a Complete Facetectomy on the Left Side to Decompress the Common Dural Tube and the  Takeoff of the Exiting Nerve Root on the Left Side the Interspace Was Then Opened Further with an Interspinous Spreader Being Used at L3-4 and L4-5 and at L2-3.  Ultimately at L340 Was Felt That an 8 x 9 x 23 Mm Spacer with 8 Degrees Lordosis Would Fit into the Interval on the Left Side to Allow for Reduction of the Scoliosis and at L2-3 and 8 x 9 x 23 Mm Spacer with 8 Degrees Lordosis Was Also Used.  Then Bone Graft Was Packed for a Total of 16 Cc of Bone Graft into the Interspace at the L4-L5 Level 10 Cc at L3-4 and 8 Cc at L2-3 the Interbody Spacers Were Then Placed under Fluoroscopic Guidance.  In the End Good Realignment Was Able to Be Achieved on Radiographic Confirmation.  Pedicle Entry Sites Were Then Chosen Using Fluoroscopy and There Was Noted That the Bone Was Quite Soft at L2 and L3 and Here 6.5 x 45 Mm Cannulated Pedicle Screws That Were Also Perforated Were Used.  On the Right Side at L4 a Cannulated Perforated 6.5 x 45 Mm Screw Was Placed on the Left Side the Bone Was Noted to Be Very Dense and Cortical and Here a 6.5 x 50 Mm Screw Was Placed.  Pedicle Entry Sites Were Then Chosen at L5 and S1 and 6.5 x 45 Mm Pedicle Screws Were Placed and L5 Using a Lateral to Medial Trajectory through the Pedicle and 6.5 x 45 Mm Screws Were Placed in S1.  Methacrylate Was Mixed to the Appropriate Consistency and 1 Cc of Methacrylate Was Injected into the Screws at L2 and L3 and on the Right-Sided Screw at L4.  Filling around the Screws Was Noted Prior to Doing This the Rods Were Fitted between the Screw Heads to Achieve a Neutral Construct with Slight Reduction of the Scoliosis Being Embedded into the Screws.  The Table Was Placed in Some Extension to Allow Increasing Lordosis of the Lumbar Spine.  Prior to Doing This the Facets at L2-3 L3-4 and L4-5 on the Right Side Were Completely Decorticated.  Remaining 10 Cc of Bone Graft Was Packed into This Lateral Gutter and Particularly into the Facets at L2 334 and 4 5 on  the Right.  Then the Construct Was Tightened in a Neutral Position with 220 Mm Rods Being Used and Contoured Fit in a Neutral Construct.  With This Hemostasis in the Soft Tissues Was Obtained Care Was Taken to Make Sure That the Pads of the L2 the L3 the L4 and the L5 Nerve Root Were Well Decompressed As Was the Common Dural Tube with This Was Verified the Retractors Were Removed the Lumbodorsal Fascia Was Closed with #1 Vicryl after Injecting 20 Cc of Half Percent Marcaine into the Paraspinous Fascia 2-0 Vicryl Was Used in the Subcutaneous Tissues 3-0 Vicryl Subcuticularly and Dermabond on the Skin.  Blood Loss Was Estimated 600 Cc and 160 Cc of Cell Saver Blood Was Returned to the Patient.

## 2022-06-04 NOTE — Anesthesia Preprocedure Evaluation (Signed)
Anesthesia Evaluation  Patient identified by MRN, date of birth, ID band Patient awake    Reviewed: Allergy & Precautions, NPO status , Patient's Chart, lab work & pertinent test results  Airway Mallampati: I  TM Distance: >3 FB Neck ROM: Full    Dental  (+) Dental Advisory Given   Pulmonary neg pulmonary ROS   breath sounds clear to auscultation       Cardiovascular hypertension, Pt. on medications + CAD   Rhythm:Regular Rate:Normal     Neuro/Psych  Headaches  Neuromuscular disease    GI/Hepatic Neg liver ROS,GERD  ,,  Endo/Other  negative endocrine ROS    Renal/GU negative Renal ROS     Musculoskeletal  (+) Arthritis ,    Abdominal   Peds  Hematology negative hematology ROS (+)   Anesthesia Other Findings   Reproductive/Obstetrics                              Lab Results  Component Value Date   WBC 4.1 05/14/2022   HGB 13.1 05/14/2022   HCT 39.4 05/14/2022   MCV 90.6 05/14/2022   PLT 167 05/14/2022   Lab Results  Component Value Date   CREATININE 0.83 05/14/2022   BUN 11 05/14/2022   NA 141 05/14/2022   K 3.9 05/14/2022   CL 109 05/14/2022   CO2 28 05/14/2022    Anesthesia Physical Anesthesia Plan  ASA: 2  Anesthesia Plan: General   Post-op Pain Management: Tylenol PO (pre-op)*, Ketamine IV* and Dilaudid IV   Induction:   PONV Risk Score and Plan: 3 and Midazolam, Dexamethasone, Ondansetron and Treatment may vary due to age or medical condition  Airway Management Planned: Oral ETT  Additional Equipment:   Intra-op Plan:   Post-operative Plan: Extubation in OR  Informed Consent: I have reviewed the patients History and Physical, chart, labs and discussed the procedure including the risks, benefits and alternatives for the proposed anesthesia with the patient or authorized representative who has indicated his/her understanding and acceptance.     Dental  advisory given  Plan Discussed with:   Anesthesia Plan Comments:          Anesthesia Quick Evaluation

## 2022-06-04 NOTE — Transfer of Care (Signed)
Immediate Anesthesia Transfer of Care Note  Patient: Donna Ferrell  Procedure(s) Performed: Lumbar two-three Lumbar three-four Lumbar four-five Posterior Lumbar Interbody Fusion, arthrodesis ,pedicle screws fixation cemented lumbar two to sacrum with removal previous hardware (Back)  Patient Location: PACU  Anesthesia Type:General  Level of Consciousness: awake and alert   Airway & Oxygen Therapy: Patient Spontanous Breathing  Post-op Assessment: Report given to RN and Post -op Vital signs reviewed and stable  Post vital signs: Reviewed and stable  Last Vitals:  Vitals Value Taken Time  BP 124/102 06/04/22 1454  Temp    Pulse 82 06/04/22 1459  Resp 22 06/04/22 1500  SpO2 98 % 06/04/22 1459  Vitals shown include unvalidated device data.  Last Pain:  Vitals:   06/04/22 0621  TempSrc:   PainSc: 2          Complications: No notable events documented.
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
## 2022-06-05 ENCOUNTER — Encounter (HOSPITAL_COMMUNITY): Payer: Self-pay | Admitting: Neurological Surgery

## 2022-06-05 ENCOUNTER — Other Ambulatory Visit: Payer: Self-pay

## 2022-06-05 LAB — BASIC METABOLIC PANEL
Anion gap: 10 (ref 5–15)
BUN: 10 mg/dL (ref 8–23)
CO2: 27 mmol/L (ref 22–32)
Calcium: 9 mg/dL (ref 8.9–10.3)
Chloride: 104 mmol/L (ref 98–111)
Creatinine, Ser: 1 mg/dL (ref 0.44–1.00)
GFR, Estimated: >60 mL/min (ref >60)
Glucose, Bld: 105 mg/dL — ABNORMAL HIGH (ref 70–99)
Potassium: 3.8 mmol/L (ref 3.5–5.1)
Sodium: 141 mmol/L (ref 135–145)

## 2022-06-05 LAB — CBC
HCT: 33.4 % — ABNORMAL LOW (ref 36.0–46.0)
Hemoglobin: 11.2 g/dL — ABNORMAL LOW (ref 12.0–15.0)
MCH: 31 pg (ref 26.0–34.0)
MCHC: 33.5 g/dL (ref 30.0–36.0)
MCV: 92.5 fL (ref 80.0–100.0)
Platelets: 195 10*3/uL (ref 150–400)
RBC: 3.61 MIL/uL — ABNORMAL LOW (ref 3.87–5.11)
RDW: 13 % (ref 11.5–15.5)
WBC: 6.9 10*3/uL (ref 4.0–10.5)
nRBC: 0 % (ref 0.0–0.2)

## 2022-06-05 MED ORDER — METHOCARBAMOL 500 MG PO TABS: 500.0000 mg | ORAL_TABLET | Freq: Four times a day (QID) | ORAL | 3 refills | Status: DC | PRN

## 2022-06-05 MED ORDER — OXYCODONE-ACETAMINOPHEN 5-325 MG PO TABS: 1.0000 | ORAL_TABLET | ORAL | 0 refills | Status: DC | PRN

## 2022-06-05 MED ORDER — DEXAMETHASONE 1 MG PO TABS: ORAL_TABLET | ORAL | 0 refills | Status: DC

## 2022-06-05 NOTE — Discharge Summary (Signed)
Physician Discharge Summary  Patient ID: Donna Ferrell MRN: [id number] DOB/AGE: Aug 04, 1960 61 y.o.  Admit date: 06/04/2022 Discharge date: 06/05/2022  Admission Diagnoses:  Discharge Diagnoses:  Principal Problem:   Spinal stenosis of lumbar region with neurogenic claudication   Discharged Condition: good  Hospital Course: Patient admitted to the hospital where she underwent multilevel lumbar decompression and fusion surgery without complications.  Postoperatively doing well.  Pain well controlled.  Standing ambulating and voiding without difficulty.  Ready for discharge home.  Consults:   Significant Diagnostic Studies:   Treatments:   Discharge Exam: Blood pressure 117/70, pulse 63, temperature 99.6 F (37.6 C), temperature source Oral, resp. rate 15, height 5\' 5"  (1.651 m), weight 57.6 kg, last menstrual period 01/17/2016, SpO2 100 %. Awake and alert.  Oriented and appropriate.  Motor and sensory function intact.  Wound clean and dry.  Chest and abdomen benign. Disposition: Discharge disposition: 01-Home or Self Care       Discharge Instructions     Call MD for:  redness, tenderness, or signs of infection (pain, swelling, redness, odor or green/yellow discharge around incision site)   Complete by: As directed    Call MD for:  severe uncontrolled pain   Complete by: As directed    Call MD for:  temperature >100.4   Complete by: As directed    Diet - low sodium heart healthy   Complete by: As directed    Discharge wound care:   Complete by: As directed    Okay to shower. Do not apply salves or appointments to incision. No heavy lifting with the upper extremities greater than 10 pounds. May resume driving when not requiring pain medication and patient feels comfortable with doing so.   Incentive spirometry RT   Complete by: As directed    Increase activity slowly   Complete by: As directed       Allergies as of 06/05/2022   No Known Allergies       Medication List     TAKE these medications    ALPRAZolam 0.25 MG tablet Commonly known as: XANAX Take 0.25 mg by mouth at bedtime as needed for sleep.   amLODipine 5 MG tablet Commonly known as: NORVASC Take 5 mg by mouth daily.   atorvastatin 20 MG tablet Commonly known as: LIPITOR Take 20 mg by mouth daily.   co-enzyme Q-10 30 MG capsule Take 30 mg by mouth daily.   dexamethasone 1 MG tablet Commonly known as: DECADRON 2 tablets twice daily for 2 days, one tablet twice daily for 2 days, one tablet daily for 2 days.   diclofenac 75 MG EC tablet Commonly known as: VOLTAREN Take 75 mg by mouth 2 (two) times daily as needed for moderate pain.   melatonin 1 MG Tabs tablet Take 1 mg by mouth at bedtime.   methocarbamol 500 MG tablet Commonly known as: ROBAXIN Take 1 tablet (500 mg total) by mouth every 6 (six) hours as needed for muscle spasms.   nitroGLYCERIN 0.4 mg/hr patch Commonly known as: NITRODUR - Dosed in mg/24 hr Place onto the skin daily. 1/3 of a patch daily   oxyCODONE-acetaminophen 5-325 MG tablet Commonly known as: PERCOCET/ROXICET Take 1-2 tablets by mouth every 4 (four) hours as needed for moderate pain or severe pain.   pantoprazole 40 MG tablet Commonly known as: PROTONIX Take 40 mg by mouth daily.   pregabalin 75 MG capsule Commonly known as: LYRICA Take 75 mg by mouth at bedtime.   rOPINIRole  0.5 MG tablet Commonly known as: REQUIP Take 0.5 mg by mouth at bedtime.   VITAMIN D PO Take 2,000 Units by mouth daily.               Discharge Care Instructions  (From admission, onward)           Start     Ordered   06/05/22 0000  Discharge wound care:       Comments: Okay to shower. Do not apply salves or appointments to incision. No heavy lifting with the upper extremities greater than 10 pounds. May resume driving when not requiring pain medication and patient feels comfortable with doing so.   06/05/22 0741             Follow-up Information     Kristeen Miss, MD. Call.   Specialty: Neurosurgery Why: As needed, If symptoms worsen Contact information: [address] [place] [address] [phone]                 Signed: Charlie Pitter 06/05/2022, 9:34 AM

## 2022-06-05 NOTE — Progress Notes (Signed)
Patient is discharged from room 3C08 at this time. Alert and in stable condition. IV site d/c'd and instructions read to patient and spouse with understanding verbalized and all questions answered. Left unit via wheelchair with all belongings at side. 

## 2022-06-05 NOTE — Discharge Summary (Signed)
Physician Discharge Summary  Patient ID: Donna Ferrell MRN: [id number] DOB/AGE: 61/12/1960 61 y.o.  Admit date: 06/04/2022 Discharge date: 06/05/2022  Admission Diagnoses: Degenerative scoliosis lumbar stenosis L2-3 L3-4 L4-5 lumbar radiculopathy history of fusion L5-S1  Discharge Diagnoses: Degenerative scoliosis of the lumbar spine.  Lumbar spinal stenosis with lateral recess stenosis on the left L2-3 L3-4 L4-5.  Lumbar radiculopathy.  History of fusion L5-S1. Principal Problem:   Spinal stenosis of lumbar region with neurogenic claudication   Discharged Condition: good  Hospital Course: Patient was admitted to undergo surgical decompression and arthrodesis from L2 to the sacrum.  This required revision of the hardware at L5-S1.  He tolerated surgery well.  Consults:  None  Significant Diagnostic Studies: None  Treatments: surgery: See op note  Discharge Exam: Blood pressure 117/70, pulse 63, temperature 99.6 F (37.6 C), temperature source Oral, resp. rate 15, height 5\' 5"  (1.651 m), weight 57.6 kg, last menstrual period 01/17/2016, SpO2 100 %. Incision is clean and dry Station and gait are intact.  Disposition: Discharge disposition: 01-Home or Self Care       Discharge Instructions     Call MD for:  redness, tenderness, or signs of infection (pain, swelling, redness, odor or green/yellow discharge around incision site)   Complete by: As directed    Call MD for:  severe uncontrolled pain   Complete by: As directed    Call MD for:  temperature >100.4   Complete by: As directed    Diet - low sodium heart healthy   Complete by: As directed    Discharge wound care:   Complete by: As directed    Okay to shower. Do not apply salves or appointments to incision. No heavy lifting with the upper extremities greater than 10 pounds. May resume driving when not requiring pain medication and patient feels comfortable with doing so.   Incentive spirometry RT   Complete  by: As directed    Increase activity slowly   Complete by: As directed       Allergies as of 06/05/2022   No Known Allergies      Medication List     TAKE these medications    ALPRAZolam 0.25 MG tablet Commonly known as: XANAX Take 0.25 mg by mouth at bedtime as needed for sleep.   amLODipine 5 MG tablet Commonly known as: NORVASC Take 5 mg by mouth daily.   atorvastatin 20 MG tablet Commonly known as: LIPITOR Take 20 mg by mouth daily.   co-enzyme Q-10 30 MG capsule Take 30 mg by mouth daily.   dexamethasone 1 MG tablet Commonly known as: DECADRON 2 tablets twice daily for 2 days, one tablet twice daily for 2 days, one tablet daily for 2 days.   diclofenac 75 MG EC tablet Commonly known as: VOLTAREN Take 75 mg by mouth 2 (two) times daily as needed for moderate pain.   melatonin 1 MG Tabs tablet Take 1 mg by mouth at bedtime.   methocarbamol 500 MG tablet Commonly known as: ROBAXIN Take 1 tablet (500 mg total) by mouth every 6 (six) hours as needed for muscle spasms.   nitroGLYCERIN 0.4 mg/hr patch Commonly known as: NITRODUR - Dosed in mg/24 hr Place onto the skin daily. 1/3 of a patch daily   oxyCODONE-acetaminophen 5-325 MG tablet Commonly known as: PERCOCET/ROXICET Take 1-2 tablets by mouth every 4 (four) hours as needed for moderate pain or severe pain.   pantoprazole 40 MG tablet Commonly known as: PROTONIX Take 40  mg by mouth daily.   pregabalin 75 MG capsule Commonly known as: LYRICA Take 75 mg by mouth at bedtime.   rOPINIRole 0.5 MG tablet Commonly known as: REQUIP Take 0.5 mg by mouth at bedtime.   VITAMIN D PO Take 2,000 Units by mouth daily.               Discharge Care Instructions  (From admission, onward)           Start     Ordered   06/05/22 0000  Discharge wound care:       Comments: Okay to shower. Do not apply salves or appointments to incision. No heavy lifting with the upper extremities greater than 10  pounds. May resume driving when not requiring pain medication and patient feels comfortable with doing so.   06/05/22 0741            Follow-up Information     Kristeen Miss, MD. Call.   Specialty: Neurosurgery Why: As needed, If symptoms worsen Contact information: [address] [place] [address] [phone]                 Signed: Earleen Newport 06/05/2022, 7:42 AM

## 2022-06-05 NOTE — Evaluation (Signed)
Occupational Therapy Evaluation Patient Details Name: Donna Ferrell MRN: [id number] DOB: 11/07/1960 Today's Date: 06/05/2022   History of Present Illness 61 y/o individual s/p PLIF L2-5 on 06/04/22. PMH: HTN, occipital neuralgia, anxiety   Clinical Impression   Pt independent at baseline with ADLs and functional mobility, currently needing supervision to complete ADLs, mod I for bed mobility and transfers without AD. Educated pt on brace wear, precautions, and compensatory strategies for ADLs, pt verbalized understanding and able to demonstrate during session. Pt presenting with impairments listed below, will follow acutely. Recommend d/c home with PRN assistance.     Recommendations for follow up therapy are one component of a multi-disciplinary discharge planning process, led by the attending physician.  Recommendations may be updated based on patient status, additional functional criteria and insurance authorization.   Follow Up Recommendations  No OT follow up     Assistance Recommended at Discharge PRN  Patient can return home with the following A little help with walking and/or transfers;A little help with bathing/dressing/bathroom;Assistance with cooking/housework;Assist for transportation;Help with stairs or ramp for entrance    Functional Status Assessment  Patient has had a recent decline in their functional status and demonstrates the ability to make significant improvements in function in a reasonable and predictable amount of time.  Equipment Recommendations  None recommended by OT    Recommendations for Other Services PT consult     Precautions / Restrictions Precautions Precautions: Back Precaution Booklet Issued: Yes (comment) Required Braces or Orthoses: Spinal Brace Spinal Brace: Lumbar corset Restrictions Weight Bearing Restrictions: No      Mobility Bed Mobility Overal bed mobility: Modified Independent                   Transfers Overall transfer level: Modified independent Equipment used: None                      Balance Overall balance assessment: Mild deficits observed, not formally tested                                         ADL either performed or assessed with clinical judgement   ADL Overall ADL's : Needs assistance/impaired Eating/Feeding: Sitting;Supervision/ safety   Grooming: Supervision/safety;Sitting   Upper Body Bathing: Supervision/ safety;Sitting   Lower Body Bathing: Supervison/ safety;Sit to/from stand   Upper Body Dressing : Supervision/safety;Sitting   Lower Body Dressing: Supervision/safety;Sitting/lateral leans   Toilet Transfer: Supervision/safety;Ambulation;BSC/3in1   Toileting- Architect and Hygiene: Supervision/safety       Functional mobility during ADLs: Supervision/safety       Vision   Vision Assessment?: No apparent visual deficits     Development worker, international aid Tested?: No   Praxis Praxis Praxis tested?: Not tested    Pertinent Vitals/Pain Pain Assessment Pain Assessment: Faces Pain Score: 2  Faces Pain Scale: Hurts a little bit Pain Location: incision Pain Descriptors / Indicators: Sore Pain Intervention(s): Limited activity within patient's tolerance, Monitored during session, Repositioned     Hand Dominance     Extremity/Trunk Assessment Upper Extremity Assessment Upper Extremity Assessment: Overall WFL for tasks assessed   Lower Extremity Assessment Lower Extremity Assessment: Generalized weakness   Cervical / Trunk Assessment Cervical / Trunk Assessment: Back Surgery   Communication Communication Communication: No difficulties   Cognition Arousal/Alertness: Awake/alert Behavior During Therapy: WFL for tasks assessed/performed Overall Cognitive Status: Within Functional Limits for tasks  assessed                                       General Comments  VSS  on RA    Exercises     Shoulder Instructions      Home Living Family/patient expects to be discharged to:: Private residence Living Arrangements: Spouse/significant other Available Help at Discharge: Family Type of Home: House Home Access: Stairs to enter Secretary/administrator of Steps: 1   Home Layout: Two level;Bed/bath upstairs Alternate Level Stairs-Number of Steps: flight Alternate Level Stairs-Rails: Right Bathroom Shower/Tub: Tub/shower unit;Walk-in shower         Home Equipment: None          Prior Functioning/Environment Prior Level of Function : Independent/Modified Independent;Working/employed;Driving                        OT Problem List: Decreased range of motion;Decreased activity tolerance;Impaired balance (sitting and/or standing)      OT Treatment/Interventions: Self-care/ADL training;Therapeutic exercise;DME and/or AE instruction;Therapeutic activities;Patient/family education;Balance training    OT Goals(Current goals can be found in the care plan section) Acute Rehab OT Goals Patient Stated Goal: none stated OT Goal Formulation: With patient Time For Goal Achievement: 06/19/22 Potential to Achieve Goals: Good  OT Frequency: Min 2X/week    Co-evaluation              AM-PAC OT "6 Clicks" Daily Activity     Outcome Measure Help from another person eating meals?: None Help from another person taking care of personal grooming?: None Help from another person toileting, which includes using toliet, bedpan, or urinal?: None Help from another person bathing (including washing, rinsing, drying)?: A Little Help from another person to put on and taking off regular upper body clothing?: None Help from another person to put on and taking off regular lower body clothing?: A Little 6 Click Score: 22   End of Session Equipment Utilized During Treatment: Back brace Nurse Communication: Mobility status  Activity Tolerance: Patient tolerated  treatment well Patient left: in bed;with call bell/phone within reach;with family/visitor present  OT Visit Diagnosis: Unsteadiness on feet (R26.81);Other abnormalities of gait and mobility (R26.89);Muscle weakness (generalized) (M62.81)                Time: 6803-2122 OT Time Calculation (min): 24 min Charges:  OT General Charges $OT Visit: 1 Visit OT Evaluation $OT Eval Low Complexity: 1 Low OT Treatments $Self Care/Home Management : 8-22 mins  Carver Fila, OTD, OTR/L SecureChat Preferred Acute Rehab [phone]   Breck Hollinger K Koonce 06/05/2022, 9:09 AM

## 2022-06-05 NOTE — Evaluation (Signed)
Physical Therapy Evaluation Patient Details Name: Donna Ferrell MRN: [id number] DOB: [birth year] Today's Date: 06/05/2022  History of Present Illness  61 y/o individual s/p PLIF L2-5 on 06/04/22. PMH: HTN, occipital neuralgia, anxiety  Clinical Impression  Patient admitted following above procedure. Patient will have significant other to assist at discharge. Patient was working prior to surgery and plans to return to work after recovery. Educated patient on back precautions, brace wear, activity progression, and car transfer, patient verbalized understanding. Overall, patient functioning at modI level with no AD and able to negotiate flight of stairs to access second floor of home. No further skilled PT needs acutely. No PT follow up recommended at this time but patient would benefit from OPPT once cleared from lifting restrictions for progressing to being able to perform heavy lifting at work.        Recommendations for follow up therapy are one component of a multi-disciplinary discharge planning process, led by the attending physician.  Recommendations may be updated based on patient status, additional functional criteria and insurance authorization.  Follow Up Recommendations No PT follow up      Assistance Recommended at Discharge PRN  Patient can return home with the following       Equipment Recommendations None recommended by PT  Recommendations for Other Services       Functional Status Assessment Patient has had a recent decline in their functional status and demonstrates the ability to make significant improvements in function in a reasonable and predictable amount of time.     Precautions / Restrictions Precautions Precautions: Back Precaution Booklet Issued: Yes (comment) Required Braces or Orthoses: Spinal Brace Spinal Brace: Lumbar corset Restrictions Weight Bearing Restrictions: No      Mobility  Bed Mobility               General bed mobility  comments: standing at doorway with significant other on arrival    Transfers Overall transfer level: Modified independent Equipment used: None                    Ambulation/Gait Ambulation/Gait assistance: Modified independent (Device/Increase time) Gait Distance (Feet): 70 Feet Assistive device: None Gait Pattern/deviations: WFL(Within Functional Limits)          Stairs Stairs: Yes Stairs assistance: Modified independent (Device/Increase time) Stair Management: One rail Right, Alternating pattern, Forwards Number of Stairs: 10    Wheelchair Mobility    Modified Rankin (Stroke Patients Only)       Balance Overall balance assessment: Mild deficits observed, not formally tested                                           Pertinent Vitals/Pain Pain Assessment Pain Assessment: Faces Faces Pain Scale: Hurts a little bit Pain Location: incision Pain Descriptors / Indicators: Sore Pain Intervention(s): Monitored during session    Home Living Family/patient expects to be discharged to:: Private residence Living Arrangements: Spouse/significant other Available Help at Discharge: Family Type of Home: House Home Access: Stairs to enter   Secretary/administrator of Steps: 1 Alternate Level Stairs-Number of Steps: flight Home Layout: Two level;Bed/bath upstairs Home Equipment: None      Prior Function Prior Level of Function : Independent/Modified Independent;Working/employed;Driving                     Hand Dominance  Extremity/Trunk Assessment   Upper Extremity Assessment Upper Extremity Assessment: Defer to OT evaluation    Lower Extremity Assessment Lower Extremity Assessment: Generalized weakness    Cervical / Trunk Assessment Cervical / Trunk Assessment: Back Surgery  Communication   Communication: No difficulties  Cognition Arousal/Alertness: Awake/alert Behavior During Therapy: WFL for tasks  assessed/performed Overall Cognitive Status: Within Functional Limits for tasks assessed                                          General Comments      Exercises     Assessment/Plan    PT Assessment Patient does not need any further PT services  PT Problem List         PT Treatment Interventions      PT Goals (Current goals can be found in the Care Plan section)  Acute Rehab PT Goals Patient Stated Goal: to go home PT Goal Formulation: All assessment and education complete, DC therapy    Frequency       Co-evaluation               AM-PAC PT "6 Clicks" Mobility  Outcome Measure Help needed turning from your back to your side while in a flat bed without using bedrails?: None Help needed moving from lying on your back to sitting on the side of a flat bed without using bedrails?: None Help needed moving to and from a bed to a chair (including a wheelchair)?: None Help needed standing up from a chair using your arms (e.g., wheelchair or bedside chair)?: None Help needed to walk in hospital room?: None Help needed climbing 3-5 steps with a railing? : None 6 Click Score: 24    End of Session Equipment Utilized During Treatment: Back brace Activity Tolerance: Patient tolerated treatment well Patient left: Other (comment) (handoff to OT) Nurse Communication: Mobility status PT Visit Diagnosis: Muscle weakness (generalized) (M62.81)    Time: 0240-9735 PT Time Calculation (min) (ACUTE ONLY): 10 min   Charges:   PT Evaluation $PT Eval Low Complexity: 1 Low          [name] A. Dan Humphreys PT, DPT Acute Rehabilitation Services Office [phone]   Viviann Spare 06/05/2022, 9:00 AM

## 2022-06-07 MED FILL — Heparin Sodium (Porcine) Inj 1000 Unit/ML: INTRAMUSCULAR | Qty: 30 | Status: AC

## 2022-06-07 MED FILL — Sodium Chloride IV Soln 0.9%: INTRAVENOUS | Qty: 1000 | Status: AC

## 2022-06-07 MED FILL — Sodium Chloride Irrigation Soln 0.9%: Qty: 3000 | Status: AC

## 2022-06-07 NOTE — Anesthesia Postprocedure Evaluation (Signed)
Anesthesia Post Note  Patient: Donna Ferrell  Procedure(s) Performed: Lumbar two-three Lumbar three-four Lumbar four-five Posterior Lumbar Interbody Fusion, arthrodesis ,pedicle screws fixation cemented lumbar two to sacrum with removal previous hardware (Back)     Patient location during evaluation: PACU Anesthesia Type: General Level of consciousness: awake and alert Pain management: pain level controlled Vital Signs Assessment: post-procedure vital signs reviewed and stable Respiratory status: spontaneous breathing, nonlabored ventilation, respiratory function stable and patient connected to nasal cannula oxygen Cardiovascular status: blood pressure returned to baseline and stable Postop Assessment: no apparent nausea or vomiting Anesthetic complications: no   No notable events documented.  Last Vitals:  Vitals:   06/05/22 0354 06/05/22 0722  BP: 105/79 117/70  Pulse: 61 63  Resp: 16 15  Temp: 36.8 C 37.6 C  SpO2: 100% 100%    Last Pain:  Vitals:   06/05/22 1005  TempSrc:   PainSc: 6                  Kennieth Rad

## 2023-03-08 ENCOUNTER — Ambulatory Visit: Payer: 59 | Admitting: Sports Medicine

## 2023-03-08 VITALS — BP 122/74 | Ht 65.0 in | Wt 140.0 lb

## 2023-03-08 DIAGNOSIS — R200 Anesthesia of skin: Secondary | ICD-10-CM

## 2023-03-08 DIAGNOSIS — R202 Paresthesia of skin: Secondary | ICD-10-CM | POA: Diagnosis not present

## 2023-03-08 DIAGNOSIS — M25562 Pain in left knee: Secondary | ICD-10-CM | POA: Diagnosis not present

## 2023-03-08 NOTE — Progress Notes (Signed)
   Subjective:    Patient ID: Donna Ferrell, adult    DOB: 27-Mar-1961, 62 y.o.   MRN: [id number]  HPI  Donna Ferrell presents today complaining of numbness in both feet which is worse first thing in the morning.  Their symptoms quickly resolved but they are also experiencing some pain in the right foot and right great toe which is more intermittent throughout the day.  Patient is status post multilevel lumbar fusion done by Dr. Danielle Dess in November.  Done well postoperatively.  Also status post remote right sided common peroneal nerve release.  This was followed by ultrasound-guided Hydro dissection months later.  Symptoms did not improve with this.  This is a chronic issue.  They have seen Dr. Daisy Blossom in the past.  Multiple EMG/nerve conduction studies have been done.  Takes Lyrica 75 mg nightly.  Also experiencing some lateral knee pain that began 5 days ago after an injury.  No swelling.  Pain is primarily along the lateral joint line but seems to be improving.  Review of Systems As above    Objective:   Physical Exam  Well-developed, fit appearing.  No acute distress  Positive Tinel's at the right fibular head, negative at the left. Slight weakness with great toe extension on the right.  Otherwise strength 5/5 Left knee: Good range of motion.  No effusion.  There is some tenderness to palpation along the lateral joint line.  Knee is stable to ligamentous exam.      Assessment & Plan:   Chronic bilateral neuropathy of unknown etiology Status post multilevel lumbar fusion-doing well Left knee sprain  Patient is interested in a repeat EMG/nerve conduction study to see if there is an obvious cause of peripheral nerve entrapment that may benefit from a repeat ultrasound-guided nerve hydrodissection in a different part of her lower leg.  Therefore, patient will be referred to Dr. Daisy Blossom to discuss this further.  In regards to her left knee, we will take a watchful waiting approach at this  time.  Follow-up as needed.  This note was dictated using Dragon naturally speaking software and may contain errors in syntax, spelling, or content which have not been identified prior to signing this note.

## 2023-03-23 ENCOUNTER — Other Ambulatory Visit: Payer: Self-pay | Admitting: Obstetrics and Gynecology

## 2023-03-23 DIAGNOSIS — Z1231 Encounter for screening mammogram for malignant neoplasm of breast: Secondary | ICD-10-CM

## 2023-04-11 ENCOUNTER — Other Ambulatory Visit: Payer: Self-pay | Admitting: Internal Medicine

## 2023-04-11 DIAGNOSIS — Z1231 Encounter for screening mammogram for malignant neoplasm of breast: Secondary | ICD-10-CM

## 2023-04-12 ENCOUNTER — Ambulatory Visit
Admission: RE | Admit: 2023-04-12 | Discharge: 2023-04-12 | Disposition: A | Discharge status: Home / Self Care | Payer: 59 | Source: Ambulatory Visit

## 2023-04-12 DIAGNOSIS — Z1231 Encounter for screening mammogram for malignant neoplasm of breast: Secondary | ICD-10-CM
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
## 2023-04-25 ENCOUNTER — Ambulatory Visit: Payer: 59 | Admitting: Obstetrics and Gynecology
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
## 2023-04-27 ENCOUNTER — Ambulatory Visit (INDEPENDENT_AMBULATORY_CARE_PROVIDER_SITE_OTHER): Payer: 59 | Admitting: Obstetrics and Gynecology

## 2023-04-27 ENCOUNTER — Other Ambulatory Visit (HOSPITAL_COMMUNITY)
Admission: RE | Admit: 2023-04-27 | Discharge: 2023-04-27 | Disposition: A | Discharge status: Home / Self Care | Payer: 59 | Source: Ambulatory Visit | Attending: Obstetrics and Gynecology | Admitting: Obstetrics and Gynecology

## 2023-04-27 ENCOUNTER — Encounter: Payer: Self-pay | Admitting: Obstetrics and Gynecology

## 2023-04-27 VITALS — BP 124/82 | Ht 65.0 in | Wt 138.0 lb

## 2023-04-27 DIAGNOSIS — Z124 Encounter for screening for malignant neoplasm of cervix: Secondary | ICD-10-CM | POA: Insufficient documentation

## 2023-04-27 DIAGNOSIS — Z01419 Encounter for gynecological examination (general) (routine) without abnormal findings: Secondary | ICD-10-CM

## 2023-04-27 NOTE — Patient Instructions (Signed)

## 2023-04-29 LAB — CYTOLOGY - PAP
Comment: NEGATIVE
Diagnosis: NEGATIVE
High risk HPV: NEGATIVE

## 2023-05-23 ENCOUNTER — Encounter (HOSPITAL_BASED_OUTPATIENT_CLINIC_OR_DEPARTMENT_OTHER): Payer: Self-pay | Admitting: Cardiology

## 2023-05-23 ENCOUNTER — Ambulatory Visit (HOSPITAL_BASED_OUTPATIENT_CLINIC_OR_DEPARTMENT_OTHER): Payer: 59 | Admitting: Cardiology

## 2023-05-23 VITALS — BP 117/76 | HR 52 | Ht 65.0 in | Wt 139.2 lb

## 2023-05-23 DIAGNOSIS — I2510 Atherosclerotic heart disease of native coronary artery without angina pectoris: Secondary | ICD-10-CM

## 2023-05-23 DIAGNOSIS — E7800 Pure hypercholesterolemia, unspecified: Secondary | ICD-10-CM

## 2023-05-23 DIAGNOSIS — Z7189 Other specified counseling: Secondary | ICD-10-CM

## 2023-05-23 DIAGNOSIS — I10 Essential (primary) hypertension: Secondary | ICD-10-CM

## 2023-05-23 MED ORDER — ASPIRIN 81 MG PO TBEC: 81.0000 mg | DELAYED_RELEASE_TABLET | Freq: Every day | ORAL | Status: AC

## 2023-05-23 NOTE — Patient Instructions (Signed)

## 2023-05-23 NOTE — Progress Notes (Signed)
Cardiology Office Note:  .   Date:  05/23/2023  ID:  Donna Ferrell, DOB Jan 20, 1961, MRN [id number] PCP: Cleatis Polka., MD  [facility] Providers Cardiologist:  Jodelle Red, MD {  History of Present Illness: .   Donna Ferrell is a 62 y.o. adult with a hx of hypertension, hyperlipidemia, coronary calcium who is seen for follow up today. Initial visit 04/14/20 as a new consult at the request of Cleatis Polka., MD for the evaluation and management of cardiovascular risk and elevated calcium score.   Cardiovascular risk factors: Comorbid conditions: hypertension, hyperlipidemia. Denies diabetes, chronic kidney disease:  Tobacco use history: former, only minimal in high school Family history: "everybody". Father had CABG at age 68 after MI. Mother had MI in her 33s. Sister has had stents. Brother is on a statin. Mat uncle died of MI. Pat Gpa died of MI. Exercise level: averages 100 mi/week on bicycle. Walks her dog, goes hiking, etc all without issues. Current diet: vegetarian/pescatarian.  Today: Finished a bike ride just over a week ago, felt dizzy and short of breath. Took her about an hour to recover. Felt like she was dehydrated, felt tired on the ride. Symptoms have not recurred. She is working on drinking more water. She also had Covid in September, wonders if it is related. Has been back to biking without issues.  ROS: Denies chest pain, shortness of breath at rest or with normal exertion. No PND, orthopnea, LE edema or unexpected weight gain. No syncope or palpitations. ROS otherwise negative except as noted.   Studies Reviewed: Marland Kitchen    EKG:  EKG Interpretation Date/Time:  Monday May 23 2023 16:22:25 EST Ventricular Rate:  52 PR Interval:  146 QRS Duration:  82 QT Interval:  460 QTC Calculation: 427 R Axis:   49  Text Interpretation: Sinus bradycardia Confirmed by Jodelle Red [phone]) on 05/23/2023 4:54:06 PM    Physical  Exam:   VS:  BP 117/76 (BP Location: Left Arm, Patient Position: Sitting, Cuff Size: Normal)   Pulse (!) 52   Ht 5\' 5"  (1.651 m)   Wt 139 lb 3.2 oz (63.1 kg)   LMP 01/17/2016 (Approximate)   SpO2 100%   BMI 23.16 kg/m    Wt Readings from Last 3 Encounters:  05/23/23 139 lb 3.2 oz (63.1 kg)  04/27/23 138 lb (62.6 kg)  03/08/23 140 lb (63.5 kg)    GEN: Well nourished, well developed in no acute distress HEENT: Normal, moist mucous membranes NECK: No JVD CARDIAC: regular rhythm, normal S1 and S2, no rubs or gallops. No murmur. VASCULAR: Radial and DP pulses 2+ bilaterally. No carotid bruits RESPIRATORY:  Clear to auscultation without rales, wheezing or rhonchi  ABDOMEN: Soft, non-tender, non-distended MUSCULOSKELETAL:  Ambulates independently SKIN: Warm and dry, no edema NEUROLOGIC:  Alert and oriented x 3. No focal neuro deficits noted. PSYCHIATRIC:  Normal affect    ASSESSMENT AND PLAN: .    Elevated calcium score: 301, at 98th percentile. This is consistent with nonocclusive CAD -continue atorvastatin 20 mg daily. Started on ezetimibe by Dr. Clelia Croft. Last lipids 02/2023 show tchol 123, tg 63, hdl 53, ldl 57 on current meds. -discussed starting aspirin today. She is amenable   Hypertension -at goal today -continue amlodipine  CV risk counseling and prevention -recommend heart healthy/Mediterranean diet, with whole grains, fruits, vegetable, fish, lean meats, nuts, and olive oil. Limit salt. -recommend moderate walking, 3-5 times/week for 30-50 minutes each session. Aim for at  least 150 minutes.week. Goal should be pace of 3 miles/hours, or walking 1.5 miles in 30 minutes -recommend avoidance of tobacco products. Avoid excess alcohol  Dispo: 12 mos or sooner as needed  Signed, Jodelle Red, MD   Jodelle Red, MD, PhD, [facility]  [facility]  [place]  [facility] at [facility] [address]  [address] [place], [place] [address] [phone]

## 2023-05-26 ENCOUNTER — Ambulatory Visit: Payer: Self-pay | Admitting: Neurology

## 2023-05-26 ENCOUNTER — Ambulatory Visit (INDEPENDENT_AMBULATORY_CARE_PROVIDER_SITE_OTHER): Payer: 59 | Admitting: Neurology

## 2023-05-26 DIAGNOSIS — G5731 Lesion of lateral popliteal nerve, right lower limb: Secondary | ICD-10-CM | POA: Diagnosis not present

## 2023-05-26 DIAGNOSIS — Z0289 Encounter for other administrative examinations: Secondary | ICD-10-CM

## 2023-05-26 NOTE — Patient Instructions (Signed)
MRI lumbar spine? MRI neurography? Cannot peform on lumbar paraspinals due to surgery

## 2023-05-26 NOTE — Progress Notes (Unsigned)
Full Name: Donna Ferrell Gender: Female MRN #: [id number] Date of Birth: [birth year]    Visit Date: 05/26/2023 07:59 Age: 62 Years Examining Physician: Dr. Naomie Dean Referring Physician: Dr. Naomie Dean Height: 5 feet 5 inch  05/26/2023: Patient denies low back pain or radiculopathy, Dr. Danielle Dess performed a lumbar surgery in 2020. She has pain in the peroneal nerve distribution below the knee. 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 Now she feels she is worsening with weakness of the right foot dorsiflexion.   Summary: Nerve conduction studies were performed on the bilateral lower extremities.  EMG needle study was performed in the right lower extremity.  The right peroneal motor nerve (when recording from the extensor digitorum brevis) showed delayed distal onset latency (8.3 ms, normal less than 6.5) and reduced amplitude (0.7 V, normal greater than 2) and decreased conduction velocity (fib head to ankle, 36 m/s, normal greater than 44).  The right peroneal motor nerve (when recording from the anterior tibialis) showed delayed distal onset latency (5.7 ms, normal less than 4.5) and decreased amplitude (0.3 mV, normal greater than 3).  The right superficial peroneal sensory nerve showed no response..The bilateral tibial F waves showed minimal delayed latency likely from prior lumbar radiculopathy and not currently clinically related.  All remaining nerves (as indicated in the following tables) were within normal limits.  The right tibialis anterior, right peroneus longus, right extensor hallucis longus muscles all showed increased motor unit amplitude, prolonged motor unit duration, polyphasic motor units and diminished motor unit recruitment. All remaining muscles (as indicated in the following tables) were within normal limits.       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  Cannot localize location of the peroneal injury, there is no conduction block or decrease in conduction velocity across the fibular head to implicate etiology at that location.  There is no indication that this is a lumbar radiculopathy. The Peroneus Longus muscle was involved(first muscle innervated by the peroneal nerve) but the biceps femoris short head was not(a more proximal peroneal-innervated muscle), so can only localize at or proximal to the take-off of the peroneus longus muscle but below the femoris biceps short head muscle.  MRI neurography/peripheral nerve MRI would be very helpful in this case but I am not sure this is offered in the local area.      ------------------------------- Naomie Dean, M.D.  [facility] [address], [address] [place], [place] [address] Tel: [phone] Fax: [phone]  Verbal informed consent was obtained from the patient, patient was informed of potential risk of procedure, including bruising, bleeding, hematoma formation, infection, muscle weakness, muscle pain, numbness, among others.      MNC    Nerve / Sites Muscle Latency Ref. Amplitude Ref. Rel Amp Segments Distance Velocity Ref. Area    ms ms mV mV %  cm m/s m/s mVms  R Peroneal - EDB     Ankle EDB 8.3 <=6.5 0.7 >=2.0 100 Ankle - EDB 9.5   3.2     Fib  head EDB 15.7  0.3  42.2 Fib head - Ankle 26.5 36 >=44 1.4     Pop fossa EDB 17.7  0.3  109 Pop fossa - Fib head 12 60 >=44 1.4  L Peroneal - EDB     Ankle EDB 5.7 <=6.5 2.1 >=2.0 100 Ankle - EDB 9    9.5     Fib head EDB 12.2  2.1  99.3 Fib head - Ankle 29 45 >=44 9.5     Pop fossa EDB 13.9  2.3  113 Pop fossa - Fib head 9.6 57 >=44 11.0         Pop fossa - Ankle      R Tibial - AH     Ankle AH 4.5 <=5.8 7.5 >=4.0 100 Ankle - AH 9   24.5     Pop fossa AH 13.6  6.3  83.2 Pop fossa - Ankle 41 45 >=41 23.4  L Tibial - AH     Ankle AH 4.9 <=5.8 8.2 >=4.0 100 Ankle - AH 9   29.2     Pop fossa AH 13.3  7.0  85.1 Pop fossa - Ankle 37 44 >=41 26.4  R Peroneal - Tib Ant     Fib Head Tib Ant 5.9 <=4.7 0.3 >=3.0 100 Fib Head - Tib Ant 10   0.5     Pop fossa Tib Ant 4.3  0.3  90.7 Pop fossa - Fib Head 8 50 >=44 0.3               SNC    Nerve / Sites Rec. Site Peak Lat Ref.  Amp Ref. Segments Distance    ms ms V V  cm  R Sural - Ankle (Calf)     Calf Ankle 3.4 <=4.4 9 >=6 Calf - Ankle 14  L Sural - Ankle (Calf)     Calf Ankle 3.0 <=4.4 10 >=6 Calf - Ankle 14  R Superficial peroneal - Ankle     Lat leg Ankle NR <=4.4 NR >=6 Lat leg - Ankle 14  L Superficial peroneal - Ankle     Lat leg Ankle 3.7 <=4.4 6 >=6 Lat leg - Ankle 14             F  Wave    Nerve F Lat Ref.   ms ms  R Tibial - AH 56.3 <=56.0  L Tibial - AH 57.3 <=56.0         EMG Summary Table    Spontaneous MUAP Recruitment  Muscle IA Fib PSW Fasc Other Amp Dur. Poly Pattern  R. Vastus medialis Normal None None None _______ Normal Normal Normal Normal  R. Tibialis anterior Normal None None None _______ Increased Increased 3+ Reduced  R. Tibialis posterior Normal None None None _______ Normal Normal Normal Normal  R. Peroneus longus Normal None None None _______ Increased Increased 2+ Reduced  R. Extensor hallucis longus Normal None None None _______ Normal Increased 3+ Reduced  R. Gastrocnemius (Medial head) Normal None None None _______ Normal Normal Normal Normal  R. Biceps femoris (long head) Normal None None None _______ Increased Normal Normal Normal  R. Biceps femoris (short head) Normal None None None _______  Normal Normal Normal Normal  R. Gluteus maximus Normal None None None _______ Normal Normal Normal Normal  R. Gluteus medius Normal None None None _______ Normal Normal Normal Normal

## 2023-05-30 NOTE — Progress Notes (Signed)
See procedure note.

## 2023-05-31 ENCOUNTER — Encounter: Payer: Self-pay | Admitting: Neurology

## 2023-05-31 DIAGNOSIS — G5731 Lesion of lateral popliteal nerve, right lower limb: Secondary | ICD-10-CM

## 2023-06-11 ENCOUNTER — Encounter: Payer: Self-pay | Admitting: Sports Medicine

## 2023-06-14 NOTE — Addendum Note (Signed)
Addended by: Bertram Savin on: 06/14/2023 10:40 AM   Modules accepted: Orders

## 2023-06-15 ENCOUNTER — Telehealth: Payer: Self-pay | Admitting: Neurology

## 2023-06-15 NOTE — Telephone Encounter (Signed)
Referral for neurosurgery sent by [facility] to [facility]. Phone: [phone], Fax: [phone]
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
## 2023-06-29 ENCOUNTER — Encounter: Payer: Self-pay | Admitting: Neurosurgery

## 2023-06-29 ENCOUNTER — Ambulatory Visit: Payer: 59 | Admitting: Neurosurgery

## 2023-06-29 VITALS — BP 142/92 | Ht 65.0 in | Wt 141.0 lb

## 2023-06-29 DIAGNOSIS — G5731 Lesion of lateral popliteal nerve, right lower limb: Secondary | ICD-10-CM

## 2023-06-29 DIAGNOSIS — S8411XA Injury of peroneal nerve at lower leg level, right leg, initial encounter: Secondary | ICD-10-CM | POA: Insufficient documentation

## 2023-07-01 ENCOUNTER — Encounter: Payer: Self-pay | Admitting: Neurosurgery

## 2023-07-01 ENCOUNTER — Encounter: Payer: Self-pay | Admitting: Sports Medicine

## 2023-07-07 ENCOUNTER — Encounter (INDEPENDENT_AMBULATORY_CARE_PROVIDER_SITE_OTHER): Payer: Self-pay

## 2023-08-04 ENCOUNTER — Ambulatory Visit
Payer: 59 | Attending: Student in an Organized Health Care Education/Training Program | Admitting: Student in an Organized Health Care Education/Training Program

## 2023-08-04 ENCOUNTER — Encounter: Payer: Self-pay | Admitting: Student in an Organized Health Care Education/Training Program

## 2023-08-04 ENCOUNTER — Ambulatory Visit
Admission: RE | Admit: 2023-08-04 | Discharge: 2023-08-04 | Disposition: A | Discharge status: Home / Self Care | Payer: 59 | Source: Ambulatory Visit | Attending: Student in an Organized Health Care Education/Training Program | Admitting: Student in an Organized Health Care Education/Training Program

## 2023-08-04 ENCOUNTER — Ambulatory Visit: Admission: RE | Admit: 2023-08-04 | Payer: 59 | Source: Ambulatory Visit

## 2023-08-04 VITALS — BP 164/117 | HR 55 | Temp 97.2°F | Resp 16 | Ht 65.0 in | Wt 138.0 lb

## 2023-08-04 DIAGNOSIS — G8929 Other chronic pain: Secondary | ICD-10-CM | POA: Diagnosis present

## 2023-08-04 DIAGNOSIS — G5731 Lesion of lateral popliteal nerve, right lower limb: Secondary | ICD-10-CM | POA: Diagnosis present

## 2023-08-04 DIAGNOSIS — M25561 Pain in right knee: Secondary | ICD-10-CM | POA: Diagnosis not present

## 2023-08-04 DIAGNOSIS — G894 Chronic pain syndrome: Secondary | ICD-10-CM | POA: Diagnosis present

## 2023-08-04 DIAGNOSIS — M5416 Radiculopathy, lumbar region: Secondary | ICD-10-CM | POA: Diagnosis present

## 2023-08-04 DIAGNOSIS — M961 Postlaminectomy syndrome, not elsewhere classified: Secondary | ICD-10-CM | POA: Diagnosis present

## 2023-08-04 NOTE — Progress Notes (Signed)
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 than 3).  The right superficial peroneal sensory nerve showed no response..The bilateral tibial F waves showed minimal delayed latency likely from prior lumbar radiculopathy and not currently clinically related.  All remaining nerves (as indicated in the following tables) were within normal limits.  The right tibialis anterior, right peroneus longus, right extensor hallucis longus muscles all showed increased motor unit amplitude, prolonged motor unit duration, polyphasic motor units and diminished motor unit recruitment. All remaining muscles (as indicated in the following tables) were within normal limits.        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  Cannot localize location of the peroneal injury, there is no conduction block or decrease in conduction velocity across the fibular head to implicate etiology at that location.  There is no indication that this is a lumbar radiculopathy. The Peroneus Longus muscle was involved(first muscle innervated by the peroneal nerve) but the biceps femoris short head was not(a more proximal peroneal-innervated muscle), so can only localize at or proximal to the take-off of the peroneus longus muscle but below the femoris biceps short head muscle.  MRI neurography/peripheral nerve MRI would be very helpful in this case but I am not sure this is offered in the local area.     Constitutional Exam  General appearance: Well nourished, well developed, and well hydrated. In no apparent acute distress Vitals:   08/04/23 0810 08/04/23 0821  BP: (!) 144/100 Marland Kitchen)  164/117  Pulse: (!) 58 (!) 55  Resp: 16   Temp: (!) 97.2 F (36.2 C)   TempSrc: Temporal   SpO2: 100%   Weight: 138 lb (62.6 kg)   Height: 5\' 5"  (1.651 m)    BMI Assessment: Estimated body mass index is 22.96 kg/m as calculated from the following:   Height as of this encounter: 5\' 5"  (1.651 m).   Weight as of this encounter: 138 lb (62.6 kg).  BMI interpretation table: BMI level Category Range association with higher incidence of chronic pain  <18 kg/m2 Underweight   18.5-24.9 kg/m2 Ideal body weight   25-29.9 kg/m2 Overweight Increased incidence by 20%  30-34.9 kg/m2 Obese (Class I) Increased incidence by 68%  35-39.9 kg/m2 Severe obesity (Class II) Increased incidence by 136%  >40 kg/m2 Extreme obesity (Class III) Increased incidence by 254%    Patient's current BMI Ideal Body weight  Body mass index is 22.96 kg/m. Ideal body weight: 57 kg (125 lb 10.6 oz) Adjusted ideal body weight: 59.2 kg (130 lb 9.6 oz)   BMI Readings from Last 4 Encounters:  08/04/23 22.96 kg/m  06/29/23 23.46 kg/m  05/23/23 23.16 kg/m  04/27/23 22.96 kg/m   Wt Readings from Last 4 Encounters:  08/04/23 138 lb (62.6 kg)  06/29/23 141 lb (64 kg)  05/23/23 139 lb 3.2 oz (63.1 kg)  04/27/23 138 lb (62.6 kg)    Psych/Mental status: Alert, oriented x 3 (person, Ferrell, & time)       Eyes: PERLA Respiratory: No evidence of acute respiratory distress  Thoracic Spine Area Exam  Skin & Axial Inspection: No masses, redness, or swelling Alignment: Symmetrical Functional ROM: Decreased ROM Stability: No instability detected Muscle Tone/Strength: Functionally intact. No obvious neuro-muscular anomalies detected. Sensory (Neurological): Unimpaired Muscle strength & Tone: No palpable anomalies  Lumbar Spine Area Exam  Skin & Axial Inspection: Well healed scar from previous spine surgery detected Alignment: Scoliosis detected Functional ROM: Pain restricted ROM       Stability: No instability detected Muscle Tone/Strength: Functionally intact. No obvious neuro-muscular anomalies detected. Sensory (Neurological): Dermatomal pain pattern  Gait & Posture Assessment  Ambulation: Unassisted Gait: Relatively normal for age and body habitus Posture: WNL  Lower Extremity Exam    Side: Right lower extremity  Side: Left lower extremity  Stability: No instability observed          Stability: No instability observed          Skin & Extremity Inspection: Skin color, temperature, and hair growth are WNL. No peripheral edema or cyanosis. No masses, redness, swelling, asymmetry, or associated skin lesions. No contractures.  Skin & Extremity Inspection: Skin color, temperature, and hair growth are WNL. No peripheral edema or cyanosis. No masses, redness, swelling,  asymmetry, or associated skin lesions. No contractures.  Functional ROM: Pain restricted ROM for hip and knee joints          Functional ROM: Unrestricted ROM                  Muscle Tone/Strength: Foot drop (L5 vs Deep Peroneal)  Muscle Tone/Strength: Functionally intact. No obvious neuro-muscular anomalies detected.  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  Visit Diagnosis (New problems to examiner): 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 Failed back surgical syndrome   3. Chronic radicular lumbar pain   4. Chronic pain syndrome    Plan of Care (Initial workup plan)      Peroneal Neuropathy Chronic peroneal neuropathy presents with progressive numbness and tingling in the lower extremity, worsening after a second back surgery in November 2023. Current symptoms include drop foot and severe numbness. A superficial peroneal nerve block is proposed as both a diagnostic and therapeutic option. If effective, further options include peripheral nerve stimulation, ablation, or surgical procedures. If ineffective, spinal cord stimulation may be considered.  We discussed a right superficial peroneal nerve block under ultrasound guidance over the mid fibular region.  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 Spinal cord  stimulation may benefit patients with prior spine surgery and ongoing pain. Symptoms will be monitored, and further interventions will be considered based on the response to the peroneal nerve block and potential spinal cord stimulation.  Chronic Low Back Pain Chronic low back pain persists despite multiple spinal surgeries, including L5-S1 fusion in 2017 and L2-S1 fusion in 2023. Spinal cord stimulation is discussed as a potential treatment to address chronic pain and improve functional status. The effectiveness of current pain management strategies will be evaluated, and spinal cord stimulation will be considered if other interventions are ineffective.  Follow-up A follow-up appointment will be scheduled to review the response to the nerve block and discuss further treatment options.    Procedure Orders         SCIATIC NERVE BLOCK     Provider-requested follow-up: Return in about 2 weeks (around 08/18/2023) for Right superficical peroneal nerve block (mid fib) [place].  Future Appointments  Date Time Provider Department Center  08/31/2023 11:45 AM Lovenia Kim, MD [facility] None    Duration of encounter: 60 minutes.  Total time on encounter, as per AMA guidelines included both the face-to-face and non-face-to-face time personally spent by the physician and/or other qualified health care professional(s) on the day of the encounter (includes time in activities that require the physician or other qualified health care professional and does not include time in activities normally performed by clinical staff). Physician's time may include the following activities when performed: Preparing to see the patient (e.g., pre-charting review of records, searching for previously ordered imaging, lab work, and nerve conduction tests) Review of prior analgesic pharmacotherapies. Reviewing PMP Interpreting ordered tests (e.g., lab work, imaging, nerve conduction tests) Performing post-procedure evaluations,  including interpretation of diagnostic procedures Obtaining and/or reviewing separately obtained history Performing a medically appropriate examination and/or evaluation Counseling and educating the patient/family/caregiver Ordering medications, tests, or procedures Referring and communicating with other health care professionals (when not separately reported) Documenting clinical information in the electronic or other health record Independently interpreting results (not separately reported) and communicating results to the patient/ family/caregiver Care coordination (not separately reported)  Note by: Edward Jolly, MD (AI and TTS technology used. I apologize for any typographical errors that were not detected and corrected.) Date: 08/04/2023; Time: 9:39 AM

## 2023-08-04 NOTE — Progress Notes (Signed)
Safety precautions to be maintained throughout the outpatient stay will include: orient to surroundings, keep bed in low position, maintain call bell within reach at all times, provide assistance with transfer out of bed and ambulation.  

## 2023-08-04 NOTE — Patient Instructions (Addendum)
Right superficial peroneal nerve block (under US)-diagnostic Consideration of SCS   ______________________________________________________________________    General Risks and Possible Complications  Patient Responsibilities: It is important that you read this as it is part of your informed consent. It is our duty to inform you of the risks and possible complications associated with treatments offered to you. It is your responsibility as a patient to read this and to ask questions about anything that is not clear or that you believe was not covered in this document.  Patient's Rights: You have the right to refuse treatment. You also have the right to change your mind, even after initially having agreed to have the treatment done. However, under this last option, if you wait until the last second to change your mind, you may be charged for the materials used up to that point.  Introduction: Medicine is not an Visual merchandiser. Everything in Medicine, including the lack of treatment(s), carries the potential for danger, harm, or loss (which is by definition: Risk). In Medicine, a complication is a secondary problem, condition, or disease that can aggravate an already existing one. All treatments carry the risk of possible complications. The fact that a side effects or complications occurs, does not imply that the treatment was conducted incorrectly. It must be clearly understood that these can happen even when everything is done following the highest safety standards.  No treatment: You can choose not to proceed with the proposed treatment alternative. The "PRO(s)" would include: avoiding the risk of complications associated with the therapy. The "CON(s)" would include: not getting any of the treatment benefits. These benefits fall under one of three categories: diagnostic; therapeutic; and/or palliative. Diagnostic benefits include: getting information which can ultimately lead to improvement of the  disease or symptom(s). Therapeutic benefits are those associated with the successful treatment of the disease. Finally, palliative benefits are those related to the decrease of the primary symptoms, without necessarily curing the condition (example: decreasing the pain from a flare-up of a chronic condition, such as incurable terminal cancer).  General Risks and Complications: These are associated to most interventional treatments. They can occur alone, or in combination. They fall under one of the following six (6) categories: no benefit or worsening of symptoms; bleeding; infection; nerve damage; allergic reactions; and/or death. No benefits or worsening of symptoms: In Medicine there are no guarantees, only probabilities. No healthcare provider can ever guarantee that a medical treatment will work, they can only state the probability that it may. Furthermore, there is always the possibility that the condition may worsen, either directly, or indirectly, as a consequence of the treatment. Bleeding: This is more common if the patient is taking a blood thinner, either prescription or over the counter (example: Goody Powders, Fish oil, Aspirin, Garlic, etc.), or if suffering a condition associated with impaired coagulation (example: Hemophilia, cirrhosis of the liver, low platelet counts, etc.). However, even if you do not have one on these, it can still happen. If you have any of these conditions, or take one of these drugs, make sure to notify your treating physician. Infection: This is more common in patients with a compromised immune system, either due to disease (example: diabetes, cancer, human immunodeficiency virus [HIV], etc.), or due to medications or treatments (example: therapies used to treat cancer and rheumatological diseases). However, even if you do not have one on these, it can still happen. If you have any of these conditions, or take one of these drugs, make sure to notify your  treating  physician. Nerve Damage: This is more common when the treatment is an invasive one, but it can also happen with the use of medications, such as those used in the treatment of cancer. The damage can occur to small secondary nerves, or to large primary ones, such as those in the spinal cord and brain. This damage may be temporary or permanent and it may lead to impairments that can range from temporary numbness to permanent paralysis and/or brain death. Allergic Reactions: Any time a substance or material comes in contact with our body, there is the possibility of an allergic reaction. These can range from a mild skin rash (contact dermatitis) to a severe systemic reaction (anaphylactic reaction), which can result in death. Death: In general, any medical intervention can result in death, most of the time due to an unforeseen complication. ______________________________________________________________________      ______________________________________________________________________    Preparing for your procedure  Appointments: If you think you may not be able to keep your appointment, call 24-48 hours in advance to cancel. We need time to make it available to others.  Procedure visits are for procedures only. During your procedure appointment there will be: NO Prescription Refills*. NO medication changes or discussions*. NO discussion of disability issues*. NO unrelated pain problem evaluations*. NO evaluations to order other pain procedures*. *These will be addressed at a separate and distinct evaluation encounter on the provider's evaluation schedule and not during procedure days.  Instructions: Food intake: Avoid eating anything solid for at least 8 hours prior to your procedure. Clear liquid intake: You may take clear liquids such as water up to 2 hours prior to your procedure. (No carbonated drinks. No soda.) Transportation: Unless otherwise stated by your physician, bring a driver.  (Driver cannot be a Market researcher, Pharmacist, community, or any other form of public transportation.) Morning Medicines: Except for blood thinners, take all of your other morning medications with a sip of water. Make sure to take your heart and blood pressure medicines. If your blood pressure's lower number is above 100, the case will be rescheduled. Blood thinners: Make sure to stop your blood thinners as instructed.  If you take a blood thinner, but were not instructed to stop it, call our office [phone] and ask to talk to a nurse. Not stopping a blood thinner prior to certain procedures could lead to serious complications. Diabetics on insulin: Notify the staff so that you can be scheduled 1st case in the morning. If your diabetes requires high dose insulin, take only  of your normal insulin dose the morning of the procedure and notify the staff that you have done so. Preventing infections: Shower with an antibacterial soap the morning of your procedure.  Build-up your immune system: Take 1000 mg of Vitamin C with every meal (3 times a day) the day prior to your procedure. Antibiotics: Inform the nursing staff if you are taking any antibiotics or if you have any conditions that may require antibiotics prior to procedures. (Example: recent joint implants)   Pregnancy: If you are pregnant make sure to notify the nursing staff. Not doing so may result in injury to the fetus, including death.  Sickness: If you have a cold, fever, or any active infections, call and cancel or reschedule your procedure. Receiving steroids while having an infection may result in complications. Arrival: You must be in the facility at least 30 minutes prior to your scheduled procedure. Tardiness: Your scheduled time is also the cutoff time. If you do  not arrive at least 15 minutes prior to your procedure, you will be rescheduled.  Children: Do not bring any children with you. Make arrangements to keep them home. Dress appropriately: There is  always a possibility that your clothing may get soiled. Avoid long dresses. Valuables: Do not bring any jewelry or valuables.  Reasons to call and reschedule or cancel your procedure: (Following these recommendations will minimize the risk of a serious complication.) Surgeries: Avoid having procedures within 2 weeks of any surgery. (Avoid for 2 weeks before or after any surgery). Flu Shots: Avoid having procedures within 2 weeks of a flu shots or . (Avoid for 2 weeks before or after immunizations). Barium: Avoid having a procedure within 7-10 days after having had a radiological study involving the use of radiological contrast. (Myelograms, Barium swallow or enema study). Heart attacks: Avoid any elective procedures or surgeries for the initial 6 months after a "Myocardial Infarction" (Heart Attack). Blood thinners: It is imperative that you stop these medications before procedures. Let us know if you if you take any blood thinner.  Infection: Avoid procedures during or within two weeks of an infection (including chest colds or gastrointestinal problems). Symptoms associated with infections include: Localized redness, fever, chills, night sweats or profuse sweating, burning sensation when voiding, cough, congestion, stuffiness, runny nose, sore throat, diarrhea, nausea, vomiting, cold or Flu symptoms, recent or current infections. It is specially important if the infection is over the area that we intend to treat. Heart and lung problems: Symptoms that may suggest an active cardiopulmonary problem include: cough, chest pain, breathing difficulties or shortness of breath, dizziness, ankle swelling, uncontrolled high or unusually low blood pressure, and/or palpitations. If you are experiencing any of these symptoms, cancel your procedure and contact your primary care physician for an evaluation.  Remember:  Regular Business hours are:  Monday to Thursday 8:00 AM to 4:00 PM  Provider's  Schedule: Delano Metz, MD:  Procedure days: Tuesday and Thursday 7:30 AM to 4:00 PM  Edward Jolly, MD:  Procedure days: Monday and Wednesday 7:30 AM to 4:00 PM Last  Updated: 06/28/2023 ______________________________________________________________________     Selective Nerve Root Block Patient Information  Description: Specific nerve roots exit the spinal canal and these nerves can be compressed and inflamed by a bulging disc and bone spurs.  By injecting steroids on the nerve root, we can potentially decrease the inflammation surrounding these nerves, which often leads to decreased pain.  Also, by injecting local anesthesia on the nerve root, this can provide [place] helpful information to give to your referring doctor if it decreases your pain.  Selective nerve root blocks can be done along the spine from the neck to the low back depending on the location of your pain.   After numbing the skin with local anesthesia, a small needle is passed to the nerve root and the position of the needle is verified using x-ray pictures.  After the needle is in correct position, we then deposit the medication.  You may experience a pressure sensation while this is being done.  The entire block usually lasts less than 15 minutes.  Conditions that may be treated with selective nerve root blocks: Low back and leg pain Spinal stenosis Diagnostic block prior to potential surgery Neck and arm pain Post laminectomy syndrome  Preparation for the injection:  Do not eat any solid food or dairy products within 8 hours of your appointment. You may drink clear liquids up to 3 hours before an appointment.  Clear liquids include water, black coffee, juice or soda.  No milk or cream please. You may take your regular medications, including pain medications, with a sip of water before your appointment.  Diabetics should hold regular insulin (if taken separately) and take 1/2 normal NPH dose the morning of the procedure.   Carry some sugar containing items with you to your appointment. A driver must accompany you and be prepared to drive you home after your procedure. Bring all your current medications with you. An IV may be inserted and sedation may be given at the discretion of the physician. A blood pressure cuff, EKG, and other monitors will often be applied during the procedure.  Some patients may need to have extra oxygen administered for a short period. You will be asked to provide medical information, including allergies, prior to the procedure.  We must know immediately if you are taking blood  Thinners (like Coumadin) or if you are allergic to IV iodine contrast (dye).  Possible side-effects: All are usually temporary Bleeding from needle site Light headedness Numbness and tingling Decreased blood pressure Weakness in arms/legs Pressure sensation in back/neck Pain at injection site (several days)  Possible complications: All are extremely rare Infection Nerve injury Spinal headache (a headache wore with upright position)  Call if you experience: Fever/chills associated with headache or increased back/neck pain Headache worsened by an upright position New onset weakness or numbness of an extremity below the injection site Hives or difficulty breathing (go to the emergency room) Inflammation or drainage at the injection site(s) Severe back/neck pain greater than usual New symptoms which are concerning to you  Please note:  Although the local anesthetic injected can often make your back or neck feel good for several hours after the injection the pain will likely return.  It takes 3-5 days for steroids to work on the nerve root. You may not notice any pain relief for at least one week.  If effective, we will often do a series of 3 injections spaced 3-6 weeks apart to maximally decrease your pain.    If you have any questions, please call [phone] [facility]  [facility]

## 2023-08-17 ENCOUNTER — Encounter: Payer: Self-pay | Admitting: Student in an Organized Health Care Education/Training Program

## 2023-08-29 ENCOUNTER — Ambulatory Visit (HOSPITAL_BASED_OUTPATIENT_CLINIC_OR_DEPARTMENT_OTHER): Payer: 59 | Admitting: Student in an Organized Health Care Education/Training Program

## 2023-08-29 ENCOUNTER — Other Ambulatory Visit: Payer: Self-pay | Admitting: Student in an Organized Health Care Education/Training Program

## 2023-08-29 ENCOUNTER — Encounter: Payer: Self-pay | Admitting: Student in an Organized Health Care Education/Training Program

## 2023-08-29 ENCOUNTER — Ambulatory Visit
Admission: RE | Admit: 2023-08-29 | Discharge: 2023-08-29 | Disposition: A | Discharge status: Home / Self Care | Payer: 59 | Source: Ambulatory Visit | Attending: Student in an Organized Health Care Education/Training Program | Admitting: Student in an Organized Health Care Education/Training Program

## 2023-08-29 VITALS — BP 144/79 | HR 57 | Temp 97.3°F | Resp 12 | Ht 65.0 in | Wt 140.0 lb

## 2023-08-29 DIAGNOSIS — Z01818 Encounter for other preprocedural examination: Secondary | ICD-10-CM | POA: Diagnosis not present

## 2023-08-29 DIAGNOSIS — G8929 Other chronic pain: Secondary | ICD-10-CM | POA: Diagnosis present

## 2023-08-29 DIAGNOSIS — M79605 Pain in left leg: Secondary | ICD-10-CM | POA: Diagnosis not present

## 2023-08-29 DIAGNOSIS — M79604 Pain in right leg: Secondary | ICD-10-CM | POA: Insufficient documentation

## 2023-08-29 DIAGNOSIS — R252 Cramp and spasm: Secondary | ICD-10-CM | POA: Diagnosis not present

## 2023-08-29 DIAGNOSIS — G5731 Lesion of lateral popliteal nerve, right lower limb: Secondary | ICD-10-CM | POA: Diagnosis not present

## 2023-08-29 DIAGNOSIS — R52 Pain, unspecified: Secondary | ICD-10-CM

## 2023-08-29 DIAGNOSIS — G629 Polyneuropathy, unspecified: Secondary | ICD-10-CM | POA: Diagnosis not present

## 2023-08-29 DIAGNOSIS — M961 Postlaminectomy syndrome, not elsewhere classified: Secondary | ICD-10-CM

## 2023-08-29 MED ORDER — LIDOCAINE HCL 2 % IJ SOLN
INTRAMUSCULAR | Status: AC
  Filled 2023-08-29: qty 20

## 2023-08-29 MED ORDER — ROPIVACAINE HCL 2 MG/ML IJ SOLN
9.0000 mL | Freq: Once | INTRAMUSCULAR | Status: AC
  Administered 2023-08-29: 9 mL via PERINEURAL

## 2023-08-29 MED ORDER — DEXAMETHASONE SODIUM PHOSPHATE 10 MG/ML IJ SOLN
INTRAMUSCULAR | Status: AC
  Filled 2023-08-29: qty 1

## 2023-08-29 MED ORDER — ROPIVACAINE HCL 2 MG/ML IJ SOLN
INTRAMUSCULAR | Status: AC
  Filled 2023-08-29: qty 20

## 2023-08-29 MED ORDER — DEXAMETHASONE SODIUM PHOSPHATE 10 MG/ML IJ SOLN
10.0000 mg | Freq: Once | INTRAMUSCULAR | Status: AC
  Administered 2023-08-29: 10 mg

## 2023-08-29 MED ORDER — LIDOCAINE HCL 2 % IJ SOLN
20.0000 mL | Freq: Once | INTRAMUSCULAR | Status: AC
  Administered 2023-08-29: 400 mg

## 2023-08-29 NOTE — Progress Notes (Signed)
 Safety precautions to be maintained throughout the outpatient stay will include: orient to surroundings, keep bed in low position, maintain call bell within reach at all times, provide assistance with transfer out of bed and ambulation.

## 2023-08-29 NOTE — Patient Instructions (Signed)

## 2023-08-29 NOTE — Progress Notes (Signed)
 PROVIDER NOTE: Interpretation of information contained herein should be left to medically-trained personnel. Specific patient instructions are provided elsewhere under "Patient Instructions" section of medical record. This document was created in part using STT-dictation technology, any transcriptional errors that may result from this process are unintentional.  Patient: Donna Ferrell Type: Established DOB: January 22, 1961 MRN: [id number] PCP: Jeannine Milroy., MD  Service: Procedure DOS: 08/29/2023 Setting: Ambulatory Location: Ambulatory outpatient facility Delivery: Face-to-face Provider: Cephus Collin, MD Specialty: Interventional Pain Management Specialty designation: 09 Location: Outpatient facility Ref. Prov.: Jeannine Milroy., MD       Interventional Therapy   Procedure:               Type: Right peroneal nerve block at the distal fibular head Laterality: Right (-RT)  Imaging: Ultrasound-guided Anesthesia: Local anesthesia (1-2% Lidocaine ) DOS: 08/29/2023  Performed by: Cephus Collin, MD  Purpose: Diagnostic/Therapeutic Indications: Chronic pain severe enough to impact quality of life or function. Rationale (medical necessity): procedure needed and proper for the diagnosis and/or treatment of Ms. Ortega's medical symptoms and needs. 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 Failed back surgical syndrome    NAS-11 Pain score:   Pre-procedure: 0-No pain (when pain occurs the level goes up to an 8)/10   Post-procedure: 0-No pain/10   Position / Prep / Materials:  Position: Supine, Modified Fowler's position with pillows under the targeted knee(s). The patient is placed in a supine position with the knee slightly flexed by placing a pillow in the popliteal fossa. Prep solution: ChloraPrep (2% chlorhexidine  gluconate and 70% isopropyl alcohol) Prep Area: Entire knee area and lateral fibular Materials:  Tray: Block Needle(s):  Type: Ultrasound [place]unk Gauge (G):  22  Length: 3.5-in  Qty: 1  H&P (Pre-op Assessment):  Ms. Castonguay is a 63 y.o. (year old), adult patient, seen today for interventional treatment. She  has a past surgical history that includes Wisdom tooth extraction (1993); Nose surgery; Skin cancer excision; Bilateral  Lumbar Five-Sacral One Lumbar Laminectomy, Complete Facetectomy, and Posterior Lateral Arthrodesis  (02/16/2016); Peroneal nerve decompression (Right, 09/2018); Cervical biopsy w/ loop electrode excision (1993); Cataract extraction (Bilateral, 2020); and L2S1 fusion (06/04/2022). Ms. Lipschutz has a current medication list which includes the following prescription(s): alprazolam , amlodipine , aspirin  ec, atorvastatin , cholecalciferol , co-enzyme q-10, melatonin, mobic, multivitamin with minerals, pantoprazole , and zetia. Her primarily concern today is the Leg Pain (Right lower leg, cramping in both legs )  Initial Vital Signs:  Pulse/HCG Rate: (!) 57ECG Heart Rate: (!) 53 Temp: (!) 97.3 F (36.3 C) Resp: 16 BP: (!) 139/91 SpO2: 100 %  BMI: Estimated body mass index is 23.3 kg/m as calculated from the following:   Height as of this encounter: 5\' 5"  (1.651 m).   Weight as of this encounter: 140 lb (63.5 kg).  Risk Assessment: Allergies: Reviewed. She has no known allergies.  Allergy Precautions: None required Coagulopathies: Reviewed. None identified.  Blood-thinner therapy: None at this time Active Infection(s): Reviewed. None identified. Ms. Badertscher is afebrile  Site Confirmation: Ms. Gunnin was asked to confirm the procedure and laterality before marking the site Procedure checklist: Completed Consent: Before the procedure and under the influence of no sedative(s), amnesic(s), or anxiolytics, the patient was informed of the treatment options, risks and possible complications. To fulfill our ethical and legal obligations, as recommended by the American Medical Association's Code of Ethics, I have informed the patient  of my clinical impression; the nature and purpose of the treatment or procedure; the risks,  benefits, and possible complications of the intervention; the alternatives, including doing nothing; the risk(s) and benefit(s) of the alternative treatment(s) or procedure(s); and the risk(s) and benefit(s) of doing nothing. The patient was provided information about the general risks and possible complications associated with the procedure. These may include, but are not limited to: failure to achieve desired goals, infection, bleeding, organ or nerve damage, allergic reactions, paralysis, and death. In addition, the patient was informed of those risks and complications associated to the procedure, such as failure to decrease pain; infection; bleeding; organ or nerve damage with subsequent damage to sensory, motor, and/or autonomic systems, resulting in permanent pain, numbness, and/or weakness of one or several areas of the body; allergic reactions; (i.e.: anaphylactic reaction); and/or death. Furthermore, the patient was informed of those risks and complications associated with the medications. These include, but are not limited to: allergic reactions (i.e.: anaphylactic or anaphylactoid reaction(s)); adrenal axis suppression; blood sugar elevation that in diabetics may result in ketoacidosis or comma; water retention that in patients with history of congestive heart failure may result in shortness of breath, pulmonary edema, and decompensation with resultant heart failure; weight gain; swelling or edema; medication-induced neural toxicity; particulate matter embolism and blood vessel occlusion with resultant organ, and/or nervous system infarction; and/or aseptic necrosis of one or more joints. Finally, the patient was informed that Medicine is not an exact science; therefore, there is also the possibility of unforeseen or unpredictable risks and/or possible complications that may result in a catastrophic outcome.  The patient indicated having understood very clearly. We have given the patient no guarantees and we have made no promises. Enough time was given to the patient to ask questions, all of which were answered to the patient's satisfaction. Ms. Cook has indicated that she wanted to continue with the procedure. Attestation: I, the ordering provider, attest that I have discussed with the patient the benefits, risks, side-effects, alternatives, likelihood of achieving goals, and potential problems during recovery for the procedure that I have provided informed consent. Date  Time: 08/29/2023  1:05 PM  Pre-Procedure Preparation:  Monitoring: As per clinic protocol. Respiration, ETCO2, SpO2, BP, heart rate and rhythm monitor placed and checked for adequate function Safety Precautions: Patient was assessed for positional comfort and pressure points before starting the procedure. Time-out: I initiated and conducted the "Time-out" before starting the procedure, as per protocol. The patient was asked to participate by confirming the accuracy of the "Time Out" information. Verification of the correct person, site, and procedure were performed and confirmed by me, the nursing staff, and the patient. "Time-out" conducted as per Joint Commission's Universal Protocol (UP.01.01.01). Time: 1407 Start Time: 1407 hrs.  Description/Narrative of Procedure:          The patient was placed in a supine position with the right knee slightly flexed and externally rotated for optimal access to the fibular head. The right lateral knee was prepped with chlorhexidine  in a sterile manner.  A high-frequency linear ultrasound transducer was used to identify the common peroneal nerve, located superficially and lateral to the fibular neck, adjacent to the biceps femoris tendon.   Under real-time ultrasound guidance, a 25-gauge, 1.5-inch needle was advanced in-plane from lateral to medial, targeting the fascial plane around the nerve.  After negative aspiration, 9 mL of 0.2% ropivacaine  mixed with 1 mL of dexamethasone  (10 mg/mL) was injected incrementally, ensuring circumferential spread around the nerve without intraneural injection.  The needle was withdrawn, and hemostasis was achieved with direct pressure. The  patient tolerated the procedure well without complications.   Vitals:   08/29/23 1410 08/29/23 1415 08/29/23 1420 08/29/23 1425  BP: 132/75 126/76 (!) 141/78 (!) 144/79  Pulse:      Resp: 15 14 12    Temp:      TempSrc:      SpO2: 100% 100% 100% 100%  Weight:      Height:         Start Time: 1407 hrs. End Time: 1422 hrs.  Post-operative Assessment:  Post-procedure Vital Signs:  Pulse/HCG Rate: (!) 57(!) 52 Temp: (!) 97.3 F (36.3 C) Resp: 12 BP: (!) 144/79 SpO2: 100 %  EBL: None  Complications: No immediate post-treatment complications observed by team, or reported by patient.  Note: The patient tolerated the entire procedure well. A repeat set of vitals were taken after the procedure and the patient was kept under observation following institutional policy, for this type of procedure. Post-procedural neurological assessment was performed, showing return to baseline, prior to discharge. The patient was provided with post-procedure discharge instructions, including a section on how to identify potential problems. Should any problems arise concerning this procedure, the patient was given instructions to immediately contact us , at any time, without hesitation. In any case, we plan to contact the patient by telephone for a follow-up status report regarding this interventional procedure.  Comments:  No additional relevant information.  Plan of Care (POC)  Orders:  No orders of the defined types were placed in this encounter.   Medications ordered for procedure: Meds ordered this encounter  Medications   ropivacaine  (PF) 2 mg/mL (0.2%) (NAROPIN ) injection 9 mL   dexamethasone  (DECADRON ) injection  10 mg   lidocaine  (XYLOCAINE ) 2 % (with pres) injection 400 mg   Medications administered: We administered ropivacaine  (PF) 2 mg/mL (0.2%), dexamethasone , and lidocaine .  See the medical record for exact dosing, route, and time of administration.  Follow-up plan:   Return in about 4 weeks (around 09/26/2023) for F2F PPE .       Right peroneal nerve block at the    Recent Visits Date Type Provider Dept  08/04/23 Office Visit Cephus Collin, MD [facility]  Showing recent visits within past 90 days and meeting all other requirements Today's Visits Date Type Provider Dept  08/29/23 Procedure visit Cephus Collin, MD [facility]  Showing today's visits and meeting all other requirements Future Appointments Date Type Provider Dept  09/27/23 Appointment Cephus Collin, MD [facility]  Showing future appointments within next 90 days and meeting all other requirements  Disposition: Discharge home  Discharge (Date  Time): 08/29/2023; 1439 hrs.   Primary Care Physician: Jeannine Milroy., MD Location: [facility] Note by: Cephus Collin, MD (TTS technology used. I apologize for any typographical errors that were not detected and corrected.) Date: 08/29/2023; Time: 3:08 PM  Disclaimer:  Medicine is not an Visual merchandiser. The only guarantee in medicine is that nothing is guaranteed. It is important to note that the decision to proceed with this intervention was based on the information collected from the patient. The Data and conclusions were drawn from the patient's questionnaire, the interview, and the physical examination. Because the information was provided in large part by the patient, it cannot be guaranteed that it has not been purposely or unconsciously manipulated. Every effort has been made to obtain as much relevant data as possible for this evaluation. It is important to note that the conclusions that lead to this procedure  are  derived in large part from the available data. Always take into account that the treatment will also be dependent on availability of resources and existing treatment guidelines, considered by other Pain Management Practitioners as being common knowledge and practice, at the time of the intervention. For Medico-Legal purposes, it is also important to point out that variation in procedural techniques and pharmacological choices are the acceptable norm. The indications, contraindications, technique, and results of the above procedure should only be interpreted and judged by a Board-Certified Interventional Pain Specialist with extensive familiarity and expertise in the same exact procedure and technique.

## 2023-08-30 ENCOUNTER — Telehealth: Payer: Self-pay

## 2023-08-30 NOTE — Telephone Encounter (Signed)
No issues post-procedure.

## 2023-08-31 ENCOUNTER — Telehealth: Payer: 59 | Admitting: Neurosurgery

## 2023-09-14 ENCOUNTER — Ambulatory Visit (INDEPENDENT_AMBULATORY_CARE_PROVIDER_SITE_OTHER): Payer: 59 | Admitting: Neurosurgery

## 2023-09-14 DIAGNOSIS — G5731 Lesion of lateral popliteal nerve, right lower limb: Secondary | ICD-10-CM | POA: Diagnosis not present

## 2023-09-14 DIAGNOSIS — S8411XA Injury of peroneal nerve at lower leg level, right leg, initial encounter: Secondary | ICD-10-CM

## 2023-09-14 DIAGNOSIS — G8929 Other chronic pain: Secondary | ICD-10-CM

## 2023-09-14 DIAGNOSIS — G629 Polyneuropathy, unspecified: Secondary | ICD-10-CM

## 2023-09-14 NOTE — Progress Notes (Signed)
 I had a follow-up phone call with Donna Ferrell today.  She was at home and I was in the office.  She gave consent to go forward with a phone visit to discuss her peroneal nerve pain.  We discussed her injection.  She states that immediately afterwards she did have a significant amount of soreness and pain but that this eventually wore off and around a 6 or 7 started to have an improvement in her symptoms.  She states that this lasted for approximately 4 to 5 days until she started to notice some intermittent cramping again.  We discussed that this technically constitutes a positive examination in my eyes.  She had a direct nerve injection that helped with her neuropathic pain.  Do feel like her generator could be at the peroneal nerve.  She has a history of a prior surgery, her EMG did demonstrate some active ongoing neuropathy at that level.  We discussed a exploration/decompression/neurolysis as a possible treatment.  She is also discussed possible spinal cord stimulation with Dr. Cherylann Ratel which we also feel like would be a good option for her.  She has chronic pain and neuropathic pain in her lower extremity she has had a previous decompression so either a direct or indirect approach to her pain modulation would be indicated.  She is going to reach out to Dr. Cherylann Ratel to discuss this further.  We spent a total of 15 minutes discussing her care.

## 2023-09-27 ENCOUNTER — Ambulatory Visit
Payer: 59 | Attending: Student in an Organized Health Care Education/Training Program | Admitting: Student in an Organized Health Care Education/Training Program

## 2023-09-27 ENCOUNTER — Encounter: Payer: Self-pay | Admitting: Student in an Organized Health Care Education/Training Program

## 2023-09-27 VITALS — BP 106/84 | HR 65 | Temp 97.2°F | Resp 16 | Ht 65.0 in | Wt 139.0 lb

## 2023-09-27 DIAGNOSIS — G894 Chronic pain syndrome: Secondary | ICD-10-CM | POA: Insufficient documentation

## 2023-09-27 DIAGNOSIS — G5731 Lesion of lateral popliteal nerve, right lower limb: Secondary | ICD-10-CM | POA: Insufficient documentation

## 2023-09-27 DIAGNOSIS — M5431 Sciatica, right side: Secondary | ICD-10-CM | POA: Insufficient documentation

## 2023-09-27 NOTE — Progress Notes (Signed)
 Safety precautions to be maintained throughout the outpatient stay will include: orient to surroundings, keep bed in low position, maintain call bell within reach at all times, provide assistance with transfer out of bed and ambulation.

## 2023-09-27 NOTE — Progress Notes (Signed)
 PROVIDER NOTE: Information contained herein reflects review and annotations entered in association with encounter. Interpretation of such information and data should be left to medically-trained personnel. Information provided to patient can be located elsewhere in the medical record under "Patient Instructions". Document created using STT-dictation technology, any transcriptional errors that may result from process are unintentional.    Patient: Donna Ferrell  Service Category: E/M  Provider: Edward Jolly, MD  DOB: 08-Apr-1961  DOS: 09/27/2023  Referring Provider: Cleatis Polka., MD  MRN: [id number]  Specialty: Interventional Pain Management  PCP: Cleatis Polka., MD  Type: Established Patient  Setting: Ambulatory outpatient    Location: Office  Delivery: Face-to-face     HPI  Ms. Rupinder Livingston, a 63 y.o. 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 Ms. Sherlin primary complain today is Pain (Perineal pain)   Pain Assessment: Severity of Chronic pain is reported as a 3 /10. Location: Perineum Right/ . Onset:  . Quality: Other (Comment), Sharp (intense). Timing:  . Modifying factor(s): deep braething, walking. Vitals:  height is 5\' 5"  (1.651 m) and weight is 139 lb (63 kg). Her temperature is 97.2 F (36.2 C) (abnormal). Her blood pressure is 106/84 and her pulse is 65. Her respiration is 16 and oxygen saturation is 100%.  BMI: Estimated body mass index is 23.13 kg/m as calculated from the following:   Height as of this encounter: 5\' 5"  (1.651 m).   Weight as of this encounter: 139 lb (63 kg). Last encounter: 08/04/2023. Last procedure: 08/29/2023.  Reason for encounter: post-procedure evaluation and assessment.    Post-procedure evaluation    Type: Right peroneal nerve block at the distal fibular head Laterality: Right (-RT)  Imaging: Ultrasound-guided Anesthesia: Local anesthesia (1-2% Lidocaine) DOS: 08/29/2023   Performed by: Edward Jolly, MD  Purpose: Diagnostic/Therapeutic Indications: Chronic pain severe enough to impact quality of life or function. Rationale (medical necessity): procedure needed and proper for the diagnosis and/or treatment of Ms. Blann's medical symptoms and needs. 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 Failed back surgical syndrome    NAS-11 Pain score:   Pre-procedure: 0-No pain (when pain occurs the level goes up to an 8)/10   Post-procedure: 0-No pain/10   Effectiveness:  Initial hour after procedure: 100 %  Subsequent 4-6 hours post-procedure: 100 %  Analgesia past initial 6 hours: 100 % (X2 weeks then acute pain came back worse than pre procedure level; cramping has returned as well but less intense; cramping has returned "in different places; now inner aspects of thighs.)  Ongoing improvement:  Analgesic:  back to baseline Function: Back to baseline ROM: Back to baseline   Discussed the use of AI scribe software for clinical note transcription with the patient, who gave verbal consent to proceed.  History of Present Illness   The patient presents with chronic leg pain and cramps following prior spine surgeries.  She experienced a week to ten days of relief from leg cramps and acute pain after receiving a right peroneal nerve block, but the pain returned with about ten percent increased intensity. The cramps have changed in pattern, no longer waking her at night but occurring at different times during the day.  She has a history of two spine surgeries.  She has been in physical therapy and continues to have lower back pain on the right side, which she believes is muscular. She uses heat, rest, stretching, and occasionally Tylenol for  relief.  She has not experienced any night leg cramps since the nerve block but reports new cramps in the inner thighs and lower leg. During the nerve block, she felt the medication in her toe.   She is currently receiving  long-term disability benefits and has applied for social security disability, having been rejected twice. She is working with a company to assist with her third application. She is concerned about the impact of her condition on her ability to cycle, which is a significant part of her life.  She has tried muscle relaxers in the past but found them to cause more sedation than relief. She has not tried magnesium supplements for muscle cramps.      HPI from initial clinic visit 08/04/2023: Hal Hope, a patient with a history of back surgeries and perineal neuropathy, presents with worsening numbness and tingling in the lower extremities, right >left. The symptoms began a couple of years after her first back surgery in 2017 (L5-S1 fusion), which was performed to address right-sided sciatica. Despite the surgeon's assurance that the nerve would heal within two years, the patient continued to experience numbness from the knee down.   In March 2020, the patient underwent a perineal nerve release performed by Dr. Danielle Dess, which provided relief for about six months. However, the numbness and tingling returned and have progressively worsened over the past year, particularly after a second back surgery in November 2020, which was an extension of her lumbar fusion, L2-S1. This second surgery, also performed by Dr. Danielle Dess, involved an extension of the patient's fusion from L2 to S1 and the insertion of three new discs.   The patient reports that her drop foot has worsened since the second surgery, and the numbness in her leg makes it feel "very dead." She also notes a return of sciatica in her left leg. Despite these issues, the patient is hesitant to undergo another surgery and is interested in exploring other treatment options.      ROS  Constitutional: Denies any fever or chills Gastrointestinal: No reported hemesis, hematochezia, vomiting, or acute GI distress Musculoskeletal: Denies any acute onset joint  swelling, redness, loss of ROM, or weakness Neurological:  right leg numbness  Medication Review  ALPRAZolam, acetaminophen, amLODipine, aspirin EC, atorvastatin, cholecalciferol, co-enzyme Q-10, melatonin, meloxicam, multivitamin with minerals, and pantoprazole  History Review  Allergy: Ms. Haubner has no known allergies. Drug: Ms. Gordy  reports no history of drug use. Alcohol:  reports current alcohol use of about 4.0 standard drinks of alcohol per week. Tobacco:  reports that she has never smoked. She has never used smokeless tobacco. Social: Ms. Pense  reports that she has never smoked. She has never used smokeless tobacco. She reports current alcohol use of about 4.0 standard drinks of alcohol per week. She reports that she does not use drugs. Medical:  has a past medical history of Anxiety, Arthritis, Bulging disc (08/21/2013), Depression, Fractured pelvis (HCC), GERD (gastroesophageal reflux disease), History of abnormal cervical Pap smear (06/1991), Hypercholesteremia (2020), Hypertension, Occipital neuralgia (2004), Osteopenia, and Tinnitus. Surgical: Ms. Hoheisel  has a past surgical history that includes Wisdom tooth extraction (1993); Nose surgery; Skin cancer excision; Bilateral  Lumbar Five-Sacral One Lumbar Laminectomy, Complete Facetectomy, and Posterior Lateral Arthrodesis  (02/16/2016); Peroneal nerve decompression (Right, 09/2018); Cervical biopsy w/ loop electrode excision (1993); Cataract extraction (Bilateral, 2020); and L2S1 fusion (06/04/2022). Family: family history includes Bipolar disorder in her sister; Breast cancer (age of onset: 70) in her mother; Dementia in her brother;  Diabetes in her father; Heart disease in her father and mother; Hyperlipidemia in her brother and sister; Hypertension in her sister; Multiple sclerosis in her sister; Other in her brother; Thyroid disease in her sister.  Laboratory Chemistry Profile   Renal Lab Results  Component Value  Date   BUN 10 06/05/2022   CREATININE 1.00 06/05/2022   BCR 18 03/18/2022   GFRAA >60 02/09/2016   GFRNONAA >60 06/05/2022    Hepatic Lab Results  Component Value Date   AST 24 03/18/2022   ALT 19 03/18/2022   ALBUMIN 4.5 03/18/2022   ALKPHOS 40 (L) 03/18/2022    Electrolytes Lab Results  Component Value Date   NA 141 06/05/2022   K 3.8 06/05/2022   CL 104 06/05/2022   CALCIUM 9.0 06/05/2022   MG 2.2 03/18/2022    Bone No results found for: "VD25OH", "VD125OH2TOT", "ZO1096EA5", "WU9811BJ4", "25OHVITD1", "25OHVITD2", "25OHVITD3", "TESTOFREE", "TESTOSTERONE"  Inflammation (CRP: Acute Phase) (ESR: Chronic Phase) No results found for: "CRP", "ESRSEDRATE", "LATICACIDVEN"       Note: Above Lab results reviewed.   EMG/NCV 05/26/2023: Patient denies low back pain or radiculopathy, Dr. Danielle Dess performed a lumbar surgery in 2020. She has pain in the peroneal nerve distribution below the knee. We diagnosed her with peroneal neuropathy a few years ago and she had hydrodisection at [facility] because there was scar tissue around the peroneal nerve but never helped. Now she feels she is worsening with weakness of the right foot dorsiflexion.    Summary: Nerve conduction studies were performed on the bilateral lower extremities.  EMG needle study was performed in the right lower extremity.  The right peroneal motor nerve (when recording from the extensor digitorum brevis) showed delayed distal onset latency (8.3 ms, normal less than 6.5) and reduced amplitude (0.7 V, normal greater than 2) and decreased conduction velocity (fib head to ankle, 36 m/s, normal greater than 44).  The right peroneal motor nerve (when recording from the anterior tibialis) showed delayed distal onset latency (5.7 ms, normal less than 4.5) and decreased amplitude (0.3 mV, normal greater than 3).  The right superficial peroneal sensory nerve showed no response..The bilateral tibial F waves showed minimal delayed latency  likely from prior lumbar radiculopathy and not currently clinically related.  All remaining nerves (as indicated in the following tables) were within normal limits.  The right tibialis anterior, right peroneus longus, right extensor hallucis longus muscles all showed increased motor unit amplitude, prolonged motor unit duration, polyphasic motor units and diminished motor unit recruitment. All remaining muscles (as indicated in the following tables) were within normal limits.        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  Cannot localize location of the peroneal injury, there is no conduction block or decrease in conduction velocity across the fibular head to implicate etiology at that location.  There is no indication that this is a lumbar radiculopathy. The Peroneus Longus muscle was involved(first muscle innervated by the peroneal nerve) but the biceps femoris short head was not(a more proximal peroneal-innervated muscle), so can only localize at or proximal to the take-off of the peroneus longus muscle but below the femoris biceps short head muscle.  MRI neurography/peripheral nerve MRI would be very helpful in this case but I am not sure this is offered in the local area.   Physical Exam  General appearance: Well nourished, well developed, and well hydrated. In no apparent acute distress Mental status: Alert, oriented x 3 (person, place, & time)       Respiratory: No evidence of acute respiratory distress Eyes: PERLA Vitals: BP 106/84   Pulse 65   Temp (!) 97.2 F (36.2 C)   Resp 16   Ht 5\' 5"  (1.651  m)   Wt 139 lb (63 kg)   LMP 01/17/2016 (Approximate)   SpO2 100%   BMI 23.13 kg/m  BMI: Estimated body mass index is 23.13 kg/m as calculated from the following:   Height as of this encounter: 5\' 5"  (1.651 m).   Weight as of this encounter: 139 lb (63 kg). Ideal: Ideal body weight: 57 kg (125 lb 10.6 oz) Adjusted ideal body weight: 59.4 kg (131 lb)  Lower Extremity Exam      Side: Right lower extremity   Side: Left lower extremity  Stability: No instability observed           Stability: No instability observed          Skin & Extremity Inspection: Skin color, temperature, and hair growth are WNL. No peripheral edema or cyanosis. No masses, redness, swelling, asymmetry, or associated skin lesions. No contractures.   Skin & Extremity Inspection: Skin color, temperature, and hair growth are WNL. No peripheral edema or cyanosis. No masses, redness, swelling, asymmetry, or associated skin lesions. No contractures.  Functional ROM: Pain restricted ROM for hip and knee joints           Functional ROM: Unrestricted ROM                  Muscle Tone/Strength: Foot drop (L5 vs Deep Peroneal)   Muscle Tone/Strength: Functionally intact. No obvious neuro-muscular anomalies detected.  Sensory (Neurological): Neurogenic pain pattern         Sensory (Neurological): Unimpaired        DTR: Patellar: deferred today Achilles: deferred today Plantar: deferred today   DTR: Patellar: deferred today Achilles: deferred today Plantar: deferred today  Palpation: No palpable anomalies   Palpation: No palpable anomalies     Assessment   Diagnosis  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 Chronic pain syndrome   3. Sciatic nerve pain, right      Updated Problems: No problems updated.  Plan of Care  Problem-specific:  Assessment and Plan    Chronic neuropathic pain: Peroneal neuropathy   She experienced temporary relief from leg cramps and acute pain after a nerve block, but the pain returned  with increased intensity. The cramps now occur at different times of the day. She has chronic numbness rather than pain, raising concerns about the effectiveness of a spinal cord stimulator (SCS), which is generally more effective for pain than neuropathy, with a success rate of 50-70%. A trial of SCS could help determine its effectiveness for her symptoms. An alternative approach is a right-sided popliteal sciatic nerve block at a more proximal location to potentially improve outcomes. Perform a right-sided popliteal sciatic nerve block at the beginning of next month. Consider a spinal cord stimulator trial if the nerve block does not provide sufficient relief. Discuss the potential for a 7-day SCS trial to assess at least 50% pain relief.   Muscular lower back pain   She has chronic  muscular pain on the right side of her lower back, which improves with rest, heat, and stretching. She has been in physical therapy occasionally uses Tylenol. Muscle relaxers have provided only short-term relief with sedation as a side effect. Recommend magnesium supplementation, 500 to 1000 mg daily, to help with muscle cramps.  Long-term disability and social security application   She is receiving long-term disability benefits through a personal plan and has applied for social security disability, which has been denied twice. She is working with a company to assist with her third application and is listed as a new provider in her application process. Provide supporting documentation for her social security disability application as needed.      Ms. Ayriel Texidor has a current medication list which includes the following long-term medication(s): amlodipine, atorvastatin, and pantoprazole.  Pharmacotherapy (Medications Ordered): No orders of the defined types were placed in this encounter.  Orders:  Orders Placed This Encounter  Procedures   SCIATIC NERVE BLOCK    Standing Status:   Future    Expiration  Date:   12/28/2023    Scheduling Instructions:     Side: Right popliteal sciatic    Where will this procedure be performed?:   [facility]   Follow-up plan:   Return in about 27 days (around 10/24/2023) for Right popliteal-sciatic US guidance.     Recent Visits Date Type Provider Dept  08/29/23 Procedure visit Edward Jolly, MD [facility]  08/04/23 Office Visit Edward Jolly, MD [facility]  Showing recent visits within past 90 days and meeting all other requirements Today's Visits Date Type Provider Dept  09/27/23 Office Visit Edward Jolly, MD [facility]  Showing today's visits and meeting all other requirements Future Appointments Date Type Provider Dept  10/24/23 Appointment Edward Jolly, MD [facility]  Showing future appointments within next 90 days and meeting all other requirements  I discussed the assessment and treatment plan with the patient. The patient was provided an opportunity to ask questions and all were answered. The patient agreed with the plan and demonstrated an understanding of the instructions.  Patient advised to call back or seek an in-person evaluation if the symptoms or condition worsens.  Duration of encounter: .  Total time on encounter, as per AMA guidelines included both the face-to-face and non-face-to-face time personally spent by the physician and/or other qualified health care professional(s) on the day of the encounter (includes time in activities that require the physician or other qualified health care professional and does not include time in activities normally performed by clinical staff). Physician's time may include the following activities when performed: Preparing to see the patient (e.g., pre-charting review of records, searching for previously ordered imaging, lab work, and nerve conduction tests) Review of prior analgesic pharmacotherapies. Reviewing PMP Interpreting ordered  tests (e.g., lab work, imaging, nerve conduction tests) Performing post-procedure evaluations, including interpretation of diagnostic procedures Obtaining and/or reviewing separately obtained history Performing a medically appropriate examination and/or evaluation Counseling and educating the patient/family/caregiver Ordering medications, tests, or procedures Referring and communicating with other health care professionals (when not separately reported) Documenting clinical information in the electronic or other health record Independently interpreting results (not separately reported) and communicating results to the patient/ family/caregiver Care coordination (not separately reported)  Note by: Edward Jolly, MD Date: 09/27/2023; Time: 3:10 PM

## 2023-10-24 ENCOUNTER — Encounter: Payer: Self-pay | Admitting: Student in an Organized Health Care Education/Training Program

## 2023-10-24 ENCOUNTER — Ambulatory Visit: Admission: RE | Admit: 2023-10-24 | Source: Ambulatory Visit

## 2023-10-24 ENCOUNTER — Ambulatory Visit
Attending: Student in an Organized Health Care Education/Training Program | Admitting: Student in an Organized Health Care Education/Training Program

## 2023-10-24 ENCOUNTER — Telehealth: Payer: Self-pay

## 2023-10-24 VITALS — BP 143/70 | Temp 97.3°F | Resp 12 | Ht 65.0 in | Wt 138.0 lb

## 2023-10-24 DIAGNOSIS — G5731 Lesion of lateral popliteal nerve, right lower limb: Secondary | ICD-10-CM | POA: Diagnosis not present

## 2023-10-24 DIAGNOSIS — M79604 Pain in right leg: Secondary | ICD-10-CM | POA: Diagnosis not present

## 2023-10-24 DIAGNOSIS — M5431 Sciatica, right side: Secondary | ICD-10-CM | POA: Diagnosis not present

## 2023-10-24 DIAGNOSIS — G894 Chronic pain syndrome: Secondary | ICD-10-CM | POA: Insufficient documentation

## 2023-10-24 MED ORDER — ROPIVACAINE HCL 2 MG/ML IJ SOLN
INTRAMUSCULAR | Status: AC
  Filled 2023-10-24: qty 20

## 2023-10-24 MED ORDER — LIDOCAINE HCL (PF) 2 % IJ SOLN
INTRAMUSCULAR | Status: AC
  Filled 2023-10-24: qty 10

## 2023-10-24 MED ORDER — LIDOCAINE HCL 2 % IJ SOLN
20.0000 mL | Freq: Once | INTRAMUSCULAR | Status: AC
  Administered 2023-10-24: 200 mg

## 2023-10-24 MED ORDER — DEXAMETHASONE SODIUM PHOSPHATE 10 MG/ML IJ SOLN
10.0000 mg | Freq: Once | INTRAMUSCULAR | Status: AC
  Administered 2023-10-24: 10 mg

## 2023-10-24 MED ORDER — DEXAMETHASONE SODIUM PHOSPHATE 10 MG/ML IJ SOLN
INTRAMUSCULAR | Status: AC
  Filled 2023-10-24: qty 2

## 2023-10-24 MED ORDER — ROPIVACAINE HCL 2 MG/ML IJ SOLN
18.0000 mL | Freq: Once | INTRAMUSCULAR | Status: AC
  Administered 2023-10-24: 18 mL via PERINEURAL

## 2023-10-24 NOTE — Progress Notes (Signed)
 Safety precautions to be maintained throughout the outpatient stay will include: orient to surroundings, keep bed in low position, maintain call bell within reach at all times, provide assistance with transfer out of bed and ambulation.

## 2023-10-24 NOTE — Telephone Encounter (Signed)
 Spoke with patient and informed her that I will giver her a call tomorrow to see how she is feeling. Recommended to her to ice 15 min on and 15 mins off. Suggested she rest and lay down with leg elevated with pillows above heart level and she states she feels her foot has swelled.   Patient verbalized understanding and questions answered.

## 2023-10-24 NOTE — Telephone Encounter (Signed)
 Patient called clinic and stated she is unable to move/flex right foot since 1430 She is unable to put weight on it. She is attempting to move right foot and unable to. She is able to extend her toes away from her but not able to flex them towards her. She also feels pressure on the right foot.   She denies pain on the right foot.

## 2023-10-24 NOTE — Patient Instructions (Signed)

## 2023-10-24 NOTE — Progress Notes (Signed)
 PROVIDER NOTE: Interpretation of information contained herein should be left to medically-trained personnel. Specific patient instructions are provided elsewhere under "Patient Instructions" section of medical record. This document was created in part using STT-dictation technology, any transcriptional errors that may result from this process are unintentional.  Patient: Donna Ferrell Type: Established DOB: Jan 27, 1961 MRN: [id number] PCP: Cleatis Polka., MD  Service: Procedure DOS: 10/24/2023 Setting: Ambulatory Location: Ambulatory outpatient facility Delivery: Face-to-face Provider: Edward Jolly, MD Specialty: Interventional Pain Management Specialty designation: 09 Location: Outpatient facility Ref. Prov.: Cleatis Polka., MD       Interventional Therapy   Procedure:               Type: Right popliteal sciatic nerve block Laterality: Right (-RT)  Imaging: Ultrasound-guided Anesthesia: Local anesthesia (1-2% Lidocaine) DOS: 10/24/2023  Performed by: Edward Jolly, MD  Purpose: Diagnostic/Therapeutic Indications: Chronic pain severe enough to impact quality of life or function. Rationale (medical necessity): procedure needed and proper for the diagnosis and/or treatment of Ms. Strothman's medical symptoms and needs. 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 Sciatic nerve pain, right   3. Chronic pain syndrome    NAS-11 Pain score:   Pre-procedure: 9 /10   Post-procedure: 2 /10   Position / Prep / Materials:  Position: Supine, Modified Fowler's position with pillows under the targeted knee(s). The patient is placed in a supine position with the knee slightly flexed by placing a pillow in the popliteal fossa. Prep solution: ChloraPrep (2% chlorhexidine gluconate and 70% isopropyl alcohol) Prep Area: Entire knee area and lateral fibular Materials:  Tray: Block Needle(s):  Type: Ultrasound [place] Gauge (G): 22  Length: 3.5-in  Qty: 1  H&P (Pre-op  Assessment):  Donna Ferrell is a 63 y.o. (year old), adult patient, seen today for interventional treatment. She  has a past surgical history that includes Wisdom tooth extraction (1993); Nose surgery; Skin cancer excision; Bilateral  Lumbar Five-Sacral One Lumbar Laminectomy, Complete Facetectomy, and Posterior Lateral Arthrodesis  (02/16/2016); Peroneal nerve decompression (Right, 09/2018); Cervical biopsy w/ loop electrode excision (1993); Cataract extraction (Bilateral, 2020); and L2S1 fusion (06/04/2022). Donna Ferrell has a current medication list which includes the following prescription(s): acetaminophen, alprazolam, amlodipine, aspirin ec, atorvastatin, cholecalciferol, co-enzyme q-10, magnesium glycinate, melatonin, mobic, multivitamin with minerals, and pantoprazole. Her primarily concern today is the Leg Pain (Right calf)  Initial Vital Signs:  Pulse/HCG Rate:  ECG Heart Rate: (!) 53 Temp: (!) 97.3 F (36.3 C) Resp: 16 BP: (!) 154/96 SpO2: 99 %  BMI: Estimated body mass index is 22.96 kg/m as calculated from the following:   Height as of this encounter: 5\' 5"  (1.651 m).   Weight as of this encounter: 138 lb (62.6 kg).  Risk Assessment: Allergies: Reviewed. She has no known allergies.  Allergy Precautions: None required Coagulopathies: Reviewed. None identified.  Blood-thinner therapy: None at this time Active Infection(s): Reviewed. None identified. Donna Ferrell is afebrile  Site Confirmation: Donna Ferrell was asked to confirm the procedure and laterality before marking the site Procedure checklist: Completed Consent: Before the procedure and under the influence of no sedative(s), amnesic(s), or anxiolytics, the patient was informed of the treatment options, risks and possible complications. To fulfill our ethical and legal obligations, as recommended by the American Medical Association's Code of Ethics, I have informed the patient of my clinical impression; the nature and purpose  of the treatment or procedure; the risks, benefits, and possible complications of the intervention; the alternatives, including doing nothing;  the risk(s) and benefit(s) of the alternative treatment(s) or procedure(s); and the risk(s) and benefit(s) of doing nothing. The patient was provided information about the general risks and possible complications associated with the procedure. These may include, but are not limited to: failure to achieve desired goals, infection, bleeding, organ or nerve damage, allergic reactions, paralysis, and death. In addition, the patient was informed of those risks and complications associated to the procedure, such as failure to decrease pain; infection; bleeding; organ or nerve damage with subsequent damage to sensory, motor, and/or autonomic systems, resulting in permanent pain, numbness, and/or weakness of one or several areas of the body; allergic reactions; (i.e.: anaphylactic reaction); and/or death. Furthermore, the patient was informed of those risks and complications associated with the medications. These include, but are not limited to: allergic reactions (i.e.: anaphylactic or anaphylactoid reaction(s)); adrenal axis suppression; blood sugar elevation that in diabetics may result in ketoacidosis or comma; water retention that in patients with history of congestive heart failure may result in shortness of breath, pulmonary edema, and decompensation with resultant heart failure; weight gain; swelling or edema; medication-induced neural toxicity; particulate matter embolism and blood vessel occlusion with resultant organ, and/or nervous system infarction; and/or aseptic necrosis of one or more joints. Finally, the patient was informed that Medicine is not an exact science; therefore, there is also the possibility of unforeseen or unpredictable risks and/or possible complications that may result in a catastrophic outcome. The patient indicated having understood very  clearly. We have given the patient no guarantees and we have made no promises. Enough time was given to the patient to ask questions, all of which were answered to the patient's satisfaction. Donna Ferrell has indicated that she wanted to continue with the procedure. Attestation: I, the ordering provider, attest that I have discussed with the patient the benefits, risks, side-effects, alternatives, likelihood of achieving goals, and potential problems during recovery for the procedure that I have provided informed consent. Date  Time: 10/24/2023 12:47 PM  Pre-Procedure Preparation:  Monitoring: As per clinic protocol. Respiration, ETCO2, SpO2, BP, heart rate and rhythm monitor placed and checked for adequate function Safety Precautions: Patient was assessed for positional comfort and pressure points before starting the procedure. Time-out: I initiated and conducted the "Time-out" before starting the procedure, as per protocol. The patient was asked to participate by confirming the accuracy of the "Time Out" information. Verification of the correct person, site, and procedure were performed and confirmed by me, the nursing staff, and the patient. "Time-out" conducted as per Joint Commission's Universal Protocol (UP.01.01.01). Time: 1308 Start Time: 1308 hrs.  Description/Narrative of Procedure:          Preparation & Equipment:  The skin over the popliteal fossa was sterilized with chlorhexidine and draped in a sterile manner. A high-frequency linear ultrasound transducer (or low-frequency curvilinear probe for deep structures) was used.  A 22-gauge, 3.5-inch block needle was used for the injection.  Ultrasound Localization:  The sciatic nerve was identified in the popliteal fossa, approximately 5-7 cm proximal to the popliteal crease, before its bifurcation into the tibial and common peroneal nerves. The nerve was visualized as a hyperechoic, oval-shaped structure between the biceps femoris  (lateral) and semimembranosus/semitendinosus (medial) muscles, adjacent to the popliteal artery and vein.  Needle Placement & Injection:  Under real-time ultrasound guidance, the needle was inserted using an in-plane approach, advancing toward the sciatic nerve without intraneural contact.  After negative aspiration for blood, 20 mL of nerve block solution below (19 cc  of 0.2% Ropivacaine+ 1 cc of Decadron 10 mg/cc) was injected, ensuring circumferential spread around the nerves. Hydrodissection was performed to confirm adequate nerve sheath expansion.  Post-Procedure Assessment:  The patient tolerated the procedure well, with no signs of vascular puncture, hematoma, or local anesthetic systemic toxicity (LAST). The patient reported sensory and motor changes in the distal leg and foot, confirming an effective block.    Vitals:   10/24/23 1309 10/24/23 1311 10/24/23 1317 10/24/23 1318  BP: 124/86 (!) 145/88 (!) 140/76 (!) 143/70  Resp: 16 18 15 12   Temp:      SpO2: 100% 100% 100% 100%  Weight:      Height:         Start Time: 1308 hrs. End Time: 1320 hrs.  Post-operative Assessment:  Post-procedure Vital Signs:  Pulse/HCG Rate:  (!) 50 Temp: (!) 97.3 F (36.3 C) Resp: 12 BP: (!) 143/70 SpO2: 100 %  EBL: None  Complications: No immediate post-treatment complications observed by team, or reported by patient.  Note: The patient tolerated the entire procedure well. A repeat set of vitals were taken after the procedure and the patient was kept under observation following institutional policy, for this type of procedure. Post-procedural neurological assessment was performed, showing return to baseline, prior to discharge. The patient was provided with post-procedure discharge instructions, including a section on how to identify potential problems. Should any problems arise concerning this procedure, the patient was given instructions to immediately contact us, at any time, without  hesitation. In any case, we plan to contact the patient by telephone for a follow-up status report regarding this interventional procedure.  Comments:  No additional relevant information.  Plan of Care (POC)  Orders:  Orders Placed This Encounter  Procedures   DG PAIN CLINIC C-ARM 1-60 MIN NO REPORT    Intraoperative interpretation by procedural physician at [facility].    Standing Status:   Standing    Number of Occurrences:   1    Reason for exam::   Assistance in needle guidance and placement for procedures requiring needle placement in or near specific anatomical locations not easily accessible without such assistance.    Medications ordered for procedure: Meds ordered this encounter  Medications   lidocaine (XYLOCAINE) 2 % (with pres) injection 400 mg   dexamethasone (DECADRON) injection 10 mg   ropivacaine (PF) 2 mg/mL (0.2%) (NAROPIN) injection 18 mL   Medications administered: We administered lidocaine, dexamethasone, and ropivacaine (PF) 2 mg/mL (0.2%).  See the medical record for exact dosing, route, and time of administration.  Follow-up plan:   Return in about 4 weeks (around 11/21/2023), or F2F PPE.       Right peroneal nerve block at the distal fibular head 08/29/2023; right popliteal sciatic nerve block 10/24/2023    Recent Visits Date Type Provider Dept  09/27/23 Office Visit Edward Jolly, MD [facility]  08/29/23 Procedure visit Edward Jolly, MD [facility]  08/04/23 Office Visit Edward Jolly, MD [facility]  Showing recent visits within past 90 days and meeting all other requirements Today's Visits Date Type Provider Dept  10/24/23 Procedure visit Edward Jolly, MD [facility]  Showing today's visits and meeting all other requirements Future Appointments Date Type Provider Dept  11/23/23 Appointment Edward Jolly, MD [facility]  Showing future appointments within next 90 days and meeting all  other requirements  Disposition: Discharge home  Discharge (Date  Time): 10/24/2023; 1324 hrs.   Primary Care Physician: Clelia Croft,  Netta Corrigan., MD Location: [facility] Note by: Edward Jolly, MD (TTS technology used. I apologize for any typographical errors that were not detected and corrected.) Date: 10/24/2023; Time: 1:37 PM  Disclaimer:  Medicine is not an Visual merchandiser. The only guarantee in medicine is that nothing is guaranteed. It is important to note that the decision to proceed with this intervention was based on the information collected from the patient. The Data and conclusions were drawn from the patient's questionnaire, the interview, and the physical examination. Because the information was provided in large part by the patient, it cannot be guaranteed that it has not been purposely or unconsciously manipulated. Every effort has been made to obtain as much relevant data as possible for this evaluation. It is important to note that the conclusions that lead to this procedure are derived in large part from the available data. Always take into account that the treatment will also be dependent on availability of resources and existing treatment guidelines, considered by other Pain Management Practitioners as being common knowledge and practice, at the time of the intervention. For Medico-Legal purposes, it is also important to point out that variation in procedural techniques and pharmacological choices are the acceptable norm. The indications, contraindications, technique, and results of the above procedure should only be interpreted and judged by a Board-Certified Interventional Pain Specialist with extensive familiarity and expertise in the same exact procedure and technique.

## 2023-10-25 ENCOUNTER — Telehealth: Payer: Self-pay | Admitting: *Deleted

## 2023-10-25 NOTE — Telephone Encounter (Signed)
 No problems post procedure.

## 2023-11-23 ENCOUNTER — Ambulatory Visit: Admitting: Student in an Organized Health Care Education/Training Program

## 2023-11-28 ENCOUNTER — Encounter: Payer: Self-pay | Admitting: Student in an Organized Health Care Education/Training Program

## 2023-11-28 ENCOUNTER — Ambulatory Visit
Attending: Student in an Organized Health Care Education/Training Program | Admitting: Student in an Organized Health Care Education/Training Program

## 2023-11-28 VITALS — BP 117/82 | HR 58 | Temp 97.2°F | Ht 65.0 in | Wt 135.0 lb

## 2023-11-28 DIAGNOSIS — G8929 Other chronic pain: Secondary | ICD-10-CM | POA: Insufficient documentation

## 2023-11-28 DIAGNOSIS — M5431 Sciatica, right side: Secondary | ICD-10-CM | POA: Insufficient documentation

## 2023-11-28 DIAGNOSIS — M961 Postlaminectomy syndrome, not elsewhere classified: Secondary | ICD-10-CM | POA: Diagnosis present

## 2023-11-28 DIAGNOSIS — G894 Chronic pain syndrome: Secondary | ICD-10-CM | POA: Insufficient documentation

## 2023-11-28 DIAGNOSIS — G5731 Lesion of lateral popliteal nerve, right lower limb: Secondary | ICD-10-CM | POA: Diagnosis present

## 2023-11-28 DIAGNOSIS — M5416 Radiculopathy, lumbar region: Secondary | ICD-10-CM | POA: Diagnosis not present

## 2023-11-28 NOTE — Progress Notes (Signed)
 PROVIDER NOTE: Interpretation of information contained herein should be left to medically-trained personnel. Specific patient instructions are provided elsewhere under "Patient Instructions" section of medical record. This document was created in part using AI and STT-dictation technology, any transcriptional errors that may result from this process are unintentional.  Patient: Donna Ferrell  Service: E/M   PCP: Jeannine Milroy., MD  DOB: 1960/08/26  DOS: 11/28/2023  Provider: Cherylin Corrigan, NP  MRN: [id number]  Delivery: Face-to-face  Specialty: Interventional Pain Management  Type: Established Patient  Setting: Ambulatory outpatient facility  Specialty designation: 09  Referring Prov.: Jeannine Milroy., MD  Location: Outpatient office facility       HPI  Donna Ferrell, a 62 y.o. year old adult, is here today because of her Sciatic nerve pain, right [M54.31]. Ms. Musacchio primary complain today is Back Pain (Lower back and right big toe)   Pain Assessment: Severity of Chronic pain is reported as a 5 /10. Location: Back Lower (right big toe)/denies. Onset: More than a month ago. Quality: Dull. Timing: Constant. Modifying factor(s): rest, heat, meds. Vitals:  height is 5\' 5"  (1.651 m) and weight is 135 lb (61.2 kg). Her temperature is 97.2 F (36.2 C) (abnormal). Her blood pressure is 117/82 and her pulse is 58 (abnormal). Her oxygen saturation is 100%.  BMI: Estimated body mass index is 22.47 kg/m as calculated from the following:   Height as of this encounter: 5\' 5"  (1.651 m).   Weight as of this encounter: 135 lb (61.2 kg). Last encounter: 10/25/2023.  Reason for encounter: post-procedure evaluation and assessment.     Procedure Type: Right popliteal sciatic nerve block Laterality: Right (-RT)  Imaging: Ultrasound-guided Anesthesia: Local anesthesia (1-2% Lidocaine ) DOS: 10/24/2023  Performed by: Cephus Collin, MD   Purpose:  Diagnostic/Therapeutic Indications: Chronic pain severe enough to impact quality of life or function. Rationale (medical necessity): procedure needed and proper for the diagnosis and/or treatment of Ms. Krawczyk's medical symptoms and needs. 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 Sciatic nerve pain, right   3. Chronic pain syndrome     NAS-11 Pain score:        Pre-procedure: 9 /10        Post-procedure: 2 /10   Effectiveness:  Initial hour after procedure: 70 %  Subsequent 4-6 hours post-procedure: 50 %  Analgesia past initial 6 hours: 0 %  Ongoing improvement:  Analgesic:  0%   ROS  Constitutional: Denies any fever or chills Gastrointestinal: No reported hemesis, hematochezia, vomiting, or acute GI distress Musculoskeletal: Right low back and right leg pain Neurological: No reported episodes of acute onset apraxia, aphasia, dysarthria, agnosia, amnesia, paralysis, loss of coordination, or loss of consciousness  Medication Review  ALPRAZolam , Magnesium  Glycinate, acetaminophen , amLODipine , aspirin  EC, atorvastatin , buPROPion , cholecalciferol , co-enzyme Q-10, melatonin, meloxicam, multivitamin with minerals, and pantoprazole   History Review  Allergy: Ms. Fernholz has no known allergies. Drug: Ms. Strawther  reports no history of drug use. Alcohol:  reports current alcohol use of about 4.0 standard drinks of alcohol per week. Tobacco:  reports that she has never smoked. She has never used smokeless tobacco. Social: Ms. Boskovich  reports that she has never smoked. She has never used smokeless tobacco. She reports current alcohol use of about 4.0 standard drinks of alcohol per week. She reports that she does not use drugs. Medical:  has a past medical history of Anxiety, Arthritis, Bulging disc (08/21/2013), Depression, Fractured pelvis (HCC), GERD (gastroesophageal reflux  disease), History of abnormal cervical Pap smear (06/1991), Hypercholesteremia (2020), Hypertension, Occipital  neuralgia (2004), Osteopenia, and Tinnitus. Surgical: Ms. Wellens  has a past surgical history that includes Wisdom tooth extraction (1993); Nose surgery; Skin cancer excision; Bilateral  Lumbar Five-Sacral One Lumbar Laminectomy, Complete Facetectomy, and Posterior Lateral Arthrodesis  (02/16/2016); Peroneal nerve decompression (Right, 09/2018); Cervical biopsy w/ loop electrode excision (1993); Cataract extraction (Bilateral, 2020); and L2S1 fusion (06/04/2022). Family: family history includes Bipolar disorder in her sister; Breast cancer (age of onset: 61) in her mother; Dementia in her brother; Diabetes in her father; Heart disease in her father and mother; Hyperlipidemia in her brother and sister; Hypertension in her sister; Multiple sclerosis in her sister; Other in her brother; Thyroid disease in her sister.  Laboratory Chemistry Profile   Renal Lab Results  Component Value Date   BUN 10 06/05/2022   CREATININE 1.00 06/05/2022   BCR 18 03/18/2022   GFRAA >60 02/09/2016   GFRNONAA >60 06/05/2022    Hepatic Lab Results  Component Value Date   AST 24 03/18/2022   ALT 19 03/18/2022   ALBUMIN 4.5 03/18/2022   ALKPHOS 40 (L) 03/18/2022    Electrolytes Lab Results  Component Value Date   NA 141 06/05/2022   K 3.8 06/05/2022   CL 104 06/05/2022   CALCIUM  9.0 06/05/2022   MG 2.2 03/18/2022    Bone No results found for: "VD25OH", "VD125OH2TOT", "WJ1914NW2", "NF6213YQ6", "25OHVITD1", "25OHVITD2", "25OHVITD3", "TESTOFREE", "TESTOSTERONE"  Inflammation (CRP: Acute Phase) (ESR: Chronic Phase) No results found for: "CRP", "ESRSEDRATE", "LATICACIDVEN"       Note: Above Lab results reviewed.    Physical Exam  General appearance: Well nourished, well developed, and well hydrated. In no apparent acute distress Mental status: Alert, oriented x 3 (person, place, & time)       Respiratory: No evidence of acute respiratory distress Eyes: PERLA Vitals: BP 117/82   Pulse (!) 58   Temp  (!) 97.2 F (36.2 C)   Ht 5\' 5"  (1.651 m)   Wt 135 lb (61.2 kg)   LMP 01/17/2016 (Approximate)   SpO2 100%   BMI 22.47 kg/m  BMI: Estimated body mass index is 22.47 kg/m as calculated from the following:   Height as of this encounter: 5\' 5"  (1.651 m).   Weight as of this encounter: 135 lb (61.2 kg). Ideal: Ideal body weight: 57 kg (125 lb 10.6 oz) Adjusted ideal body weight: 58.7 kg (129 lb 6.4 oz)  Assessment   Diagnosis  1. Sciatic nerve pain, right   2. Chronic pain syndrome   3. Failed back surgical syndrome   4. Chronic radicular lumbar pain   5. Peroneal neuropathy at knee, right      Plan of Care  Unfortunately, no significant relief with her previous right popliteal sciatic nerve block.  I do not recommend that we proceed with a peripheral nerve stimulator trial.  She is a candidate for a spinal cord stimulator.  She spoke with her surgeon last week who advised against a spinal cord stimulator.  I informed her that if her thoughts change about a spinal cord stimulator trial to reach out to my clinic and let me know.  I will be happy to see her going forward on a as needed basis. Follow-up plan:   No follow-ups on file.    Recent Visits Date Type Provider Dept  10/24/23 Procedure visit Cephus Collin, MD [facility]  09/27/23 Office Visit Cephus Collin, MD [facility]  Showing recent visits within past 90 days and meeting all other requirements Today's Visits Date Type Provider Dept  11/28/23 Office Visit Cephus Collin, MD [facility]  Showing today's visits and meeting all other requirements Future Appointments No visits were found meeting these conditions. Showing future appointments within next 90 days and meeting all other requirements  I discussed the assessment and treatment plan with the patient. The patient was provided an opportunity to ask questions and all were answered. The patient agreed with the plan and demonstrated an  understanding of the instructions.  Patient advised to call back or seek an in-person evaluation if the symptoms or condition worsens.  Duration of encounter: .  Total time on encounter, as per AMA guidelines included both the face-to-face and non-face-to-face time personally spent by the physician and/or other qualified health care professional(s) on the day of the encounter (includes time in activities that require the physician or other qualified health care professional and does not include time in activities normally performed by clinical staff). Physician's time may include the following activities when performed: Preparing to see the patient (e.g., pre-charting review of records, searching for previously ordered imaging, lab work, and nerve conduction tests) Review of prior analgesic pharmacotherapies. Reviewing PMP Interpreting ordered tests (e.g., lab work, imaging, nerve conduction tests) Performing post-procedure evaluations, including interpretation of diagnostic procedures Obtaining and/or reviewing separately obtained history Performing a medically appropriate examination and/or evaluation Counseling and educating the patient/family/caregiver Ordering medications, tests, or procedures Referring and communicating with other health care professionals (when not separately reported) Documenting clinical information in the electronic or other health record Independently interpreting results (not separately reported) and communicating results to the patient/ family/caregiver Care coordination (not separately reported)  Note by: Grizel Vesely K Herald Vallin, NP (TTS and AI technology used. I apologize for any typographical errors that were not detected and corrected.) Date: 11/28/2023; Time: 3:15 PM

## 2023-11-28 NOTE — Progress Notes (Signed)
 Safety precautions to be maintained throughout the outpatient stay will include: orient to surroundings, keep bed in low position, maintain call bell within reach at all times, provide assistance with transfer out of bed and ambulation.

## 2024-03-21 ENCOUNTER — Ambulatory Visit (INDEPENDENT_AMBULATORY_CARE_PROVIDER_SITE_OTHER): Admitting: Family Medicine

## 2024-03-21 VITALS — BP 122/82 | Ht 65.0 in | Wt 134.0 lb

## 2024-03-21 DIAGNOSIS — M79671 Pain in right foot: Secondary | ICD-10-CM | POA: Diagnosis not present

## 2024-03-21 DIAGNOSIS — R252 Cramp and spasm: Secondary | ICD-10-CM | POA: Diagnosis not present
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
## 2024-04-13 ENCOUNTER — Encounter

## 2024-04-13 DIAGNOSIS — Z1231 Encounter for screening mammogram for malignant neoplasm of breast: Secondary | ICD-10-CM

## 2024-04-30 ENCOUNTER — Ambulatory Visit
Admission: RE | Admit: 2024-04-30 | Discharge: 2024-04-30 | Disposition: A | Discharge status: Home / Self Care | Source: Ambulatory Visit

## 2024-04-30 DIAGNOSIS — Z1231 Encounter for screening mammogram for malignant neoplasm of breast: Secondary | ICD-10-CM

## 2024-06-04 ENCOUNTER — Encounter: Payer: Self-pay | Admitting: Orthopedic Surgery

## 2024-06-04 ENCOUNTER — Ambulatory Visit: Admitting: Orthopedic Surgery

## 2024-06-04 VITALS — BP 145/86 | Ht 65.0 in | Wt 134.0 lb

## 2024-06-04 DIAGNOSIS — F641 Gender identity disorder in adolescence and adulthood: Secondary | ICD-10-CM | POA: Insufficient documentation

## 2024-06-04 DIAGNOSIS — S7011XA Contusion of right thigh, initial encounter: Secondary | ICD-10-CM

## 2024-06-04 DIAGNOSIS — Z79890 Hormone replacement therapy (postmenopausal): Secondary | ICD-10-CM

## 2024-06-04 DIAGNOSIS — M8580 Other specified disorders of bone density and structure, unspecified site: Secondary | ICD-10-CM | POA: Insufficient documentation

## 2024-06-04 NOTE — Progress Notes (Signed)
  Intake history:  Chief Complaint  Patient presents with   Leg Pain    Right thigh vs corner of dining room table 3 weeks ago      BP (!) 145/86   Ht 5' 5 (1.651 m)   Wt 134 lb (60.8 kg)   LMP 01/17/2016 (Approximate)   BMI 22.30 kg/m  Body mass index is 22.3 kg/m.  Pharmacy? _[facility] [address]__  WHAT ARE WE SEEING YOU FOR TODAY?   Right leg thigh / down leg states peroneal nerve trouble has increased foot numbness has increased since thigh injury 3 weeks ago hit on corner of table   How long has this bothered you? (DOI?DOS?WS?)  3w  Was there an injury? Yes  Anticoag.  No   Any ALLERGIES ______________________________________________   Treatment:  Have you taken:  Tylenol  Yes  Advil No  Had PT Yes  Had injection No  Other  _________________________
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
## 2024-06-05 ENCOUNTER — Encounter: Payer: Self-pay | Admitting: Obstetrics and Gynecology

## 2024-06-07 ENCOUNTER — Ambulatory Visit: Admitting: Orthopaedic Surgery

## 2024-06-22 ENCOUNTER — Encounter: Payer: Self-pay | Admitting: Neurosurgery

## 2024-07-23 ENCOUNTER — Telehealth: Payer: Self-pay | Admitting: Orthopedic Surgery

## 2024-07-23 NOTE — Telephone Encounter (Signed)
 Updated medical history per Dr Margrette to correct problem list

## 2024-10-03 ENCOUNTER — Ambulatory Visit: Admitting: Obstetrics and Gynecology
